# Patient Record
Sex: Female | Born: 1970 | Race: Black or African American | Hispanic: No | Smoking: Never smoker
Health system: Southern US, Community
[De-identification: ages and names within clinical notes are randomized; demographics above are authoritative.]

## PROBLEM LIST (undated history)

## (undated) DIAGNOSIS — I639 Cerebral infarction, unspecified: Secondary | ICD-10-CM

## (undated) DIAGNOSIS — G459 Transient cerebral ischemic attack, unspecified: Secondary | ICD-10-CM

## (undated) DIAGNOSIS — G40909 Epilepsy, unspecified, not intractable, without status epilepticus: Secondary | ICD-10-CM

## (undated) DIAGNOSIS — J45909 Unspecified asthma, uncomplicated: Secondary | ICD-10-CM

## (undated) DIAGNOSIS — J302 Other seasonal allergic rhinitis: Secondary | ICD-10-CM

## (undated) DIAGNOSIS — E559 Vitamin D deficiency, unspecified: Secondary | ICD-10-CM

## (undated) DIAGNOSIS — R7303 Prediabetes: Secondary | ICD-10-CM

## (undated) DIAGNOSIS — R569 Unspecified convulsions: Secondary | ICD-10-CM

## (undated) DIAGNOSIS — D509 Iron deficiency anemia, unspecified: Secondary | ICD-10-CM

## (undated) DIAGNOSIS — Z8632 Personal history of gestational diabetes: Secondary | ICD-10-CM

## (undated) DIAGNOSIS — E538 Deficiency of other specified B group vitamins: Secondary | ICD-10-CM

## (undated) DIAGNOSIS — Z862 Personal history of diseases of the blood and blood-forming organs and certain disorders involving the immune mechanism: Secondary | ICD-10-CM

## (undated) DIAGNOSIS — R600 Localized edema: Secondary | ICD-10-CM

## (undated) DIAGNOSIS — E669 Obesity, unspecified: Secondary | ICD-10-CM

## (undated) DIAGNOSIS — D250 Submucous leiomyoma of uterus: Secondary | ICD-10-CM

## (undated) DIAGNOSIS — D649 Anemia, unspecified: Secondary | ICD-10-CM

## (undated) DIAGNOSIS — M79661 Pain in right lower leg: Secondary | ICD-10-CM

## (undated) DIAGNOSIS — M25561 Pain in right knee: Secondary | ICD-10-CM

## (undated) DIAGNOSIS — K5900 Constipation, unspecified: Secondary | ICD-10-CM

## (undated) DIAGNOSIS — M79662 Pain in left lower leg: Secondary | ICD-10-CM

## (undated) DIAGNOSIS — F419 Anxiety disorder, unspecified: Secondary | ICD-10-CM

## (undated) DIAGNOSIS — D219 Benign neoplasm of connective and other soft tissue, unspecified: Secondary | ICD-10-CM

## (undated) DIAGNOSIS — G43909 Migraine, unspecified, not intractable, without status migrainosus: Secondary | ICD-10-CM

## (undated) DIAGNOSIS — F32A Depression, unspecified: Secondary | ICD-10-CM

## (undated) DIAGNOSIS — Z8619 Personal history of other infectious and parasitic diseases: Secondary | ICD-10-CM

## (undated) DIAGNOSIS — G4730 Sleep apnea, unspecified: Secondary | ICD-10-CM

## (undated) DIAGNOSIS — M2550 Pain in unspecified joint: Secondary | ICD-10-CM

## (undated) DIAGNOSIS — M722 Plantar fascial fibromatosis: Secondary | ICD-10-CM

## (undated) DIAGNOSIS — Z8759 Personal history of other complications of pregnancy, childbirth and the puerperium: Secondary | ICD-10-CM

## (undated) HISTORY — DX: Pain in right lower leg: M79.661

## (undated) HISTORY — DX: Anxiety disorder, unspecified: F41.9

## (undated) HISTORY — DX: Constipation, unspecified: K59.00

## (undated) HISTORY — DX: Pain in unspecified joint: M25.50

## (undated) HISTORY — DX: Plantar fascial fibromatosis: M72.2

## (undated) HISTORY — DX: Pain in right knee: M25.561

## (undated) HISTORY — DX: Sleep apnea, unspecified: G47.30

## (undated) HISTORY — PX: GYNECOLOGIC CRYOSURGERY: SHX857

## (undated) HISTORY — DX: Anemia, unspecified: D64.9

## (undated) HISTORY — DX: Depression, unspecified: F32.A

## (undated) HISTORY — DX: Vitamin D deficiency, unspecified: E55.9

## (undated) HISTORY — DX: Localized edema: R60.0

## (undated) HISTORY — DX: Prediabetes: R73.03

## (undated) HISTORY — DX: Transient cerebral ischemic attack, unspecified: G45.9

## (undated) HISTORY — DX: Benign neoplasm of connective and other soft tissue, unspecified: D21.9

## (undated) HISTORY — DX: Deficiency of other specified B group vitamins: E53.8

## (undated) HISTORY — DX: Morbid (severe) obesity due to excess calories: E66.01

## (undated) HISTORY — DX: Other seasonal allergic rhinitis: J30.2

## (undated) HISTORY — DX: Unspecified asthma, uncomplicated: J45.909

## (undated) HISTORY — PX: CRANIOTOMY FOR TEMPORAL LOBECTOMY: SUR342

## (undated) HISTORY — DX: Cerebral infarction, unspecified: I63.9

## (undated) HISTORY — DX: Epilepsy, unspecified, not intractable, without status epilepticus: G40.909

## (undated) HISTORY — DX: Pain in left lower leg: M79.662

---

## 1998-04-27 ENCOUNTER — Emergency Department (HOSPITAL_COMMUNITY): Admission: EM | Admit: 1998-04-27 | Discharge: 1998-04-27 | Payer: Self-pay | Admitting: Emergency Medicine

## 1998-11-05 HISTORY — PX: OTHER SURGICAL HISTORY: SHX169

## 1999-02-13 ENCOUNTER — Other Ambulatory Visit: Admission: RE | Admit: 1999-02-13 | Discharge: 1999-02-13 | Payer: Self-pay | Admitting: Obstetrics & Gynecology

## 1999-03-13 ENCOUNTER — Other Ambulatory Visit: Admission: RE | Admit: 1999-03-13 | Discharge: 1999-03-13 | Payer: Self-pay | Admitting: Obstetrics & Gynecology

## 1999-06-07 ENCOUNTER — Ambulatory Visit (HOSPITAL_COMMUNITY): Admission: RE | Admit: 1999-06-07 | Discharge: 1999-06-07 | Payer: Self-pay | Admitting: Internal Medicine

## 1999-06-07 ENCOUNTER — Encounter: Payer: Self-pay | Admitting: Internal Medicine

## 1999-07-09 ENCOUNTER — Emergency Department (HOSPITAL_COMMUNITY): Admission: EM | Admit: 1999-07-09 | Discharge: 1999-07-09 | Payer: Self-pay | Admitting: Emergency Medicine

## 1999-08-15 ENCOUNTER — Encounter: Payer: Self-pay | Admitting: Otolaryngology

## 1999-08-15 ENCOUNTER — Ambulatory Visit (HOSPITAL_COMMUNITY): Admission: RE | Admit: 1999-08-15 | Discharge: 1999-08-15 | Payer: Self-pay | Admitting: Otolaryngology

## 1999-08-22 ENCOUNTER — Other Ambulatory Visit: Admission: RE | Admit: 1999-08-22 | Discharge: 1999-08-22 | Payer: Self-pay | Admitting: Obstetrics & Gynecology

## 1999-08-24 ENCOUNTER — Ambulatory Visit (HOSPITAL_BASED_OUTPATIENT_CLINIC_OR_DEPARTMENT_OTHER): Admission: RE | Admit: 1999-08-24 | Discharge: 1999-08-25 | Payer: Self-pay | Admitting: Otolaryngology

## 2001-04-02 ENCOUNTER — Other Ambulatory Visit: Admission: RE | Admit: 2001-04-02 | Discharge: 2001-04-02 | Payer: Self-pay | Admitting: Obstetrics & Gynecology

## 2002-06-19 ENCOUNTER — Other Ambulatory Visit: Admission: RE | Admit: 2002-06-19 | Discharge: 2002-06-19 | Payer: Self-pay | Admitting: Obstetrics & Gynecology

## 2004-01-13 ENCOUNTER — Other Ambulatory Visit: Admission: RE | Admit: 2004-01-13 | Discharge: 2004-01-13 | Payer: Self-pay | Admitting: Obstetrics and Gynecology

## 2005-02-13 ENCOUNTER — Other Ambulatory Visit: Admission: RE | Admit: 2005-02-13 | Discharge: 2005-02-13 | Payer: Self-pay | Admitting: Gynecology

## 2005-02-27 ENCOUNTER — Encounter: Admission: RE | Admit: 2005-02-27 | Discharge: 2005-02-27 | Payer: Self-pay | Admitting: Gastroenterology

## 2006-02-19 ENCOUNTER — Other Ambulatory Visit: Admission: RE | Admit: 2006-02-19 | Discharge: 2006-02-19 | Payer: Self-pay | Admitting: Gynecology

## 2007-04-29 ENCOUNTER — Other Ambulatory Visit: Admission: RE | Admit: 2007-04-29 | Discharge: 2007-04-29 | Payer: Self-pay | Admitting: Obstetrics and Gynecology

## 2007-06-10 ENCOUNTER — Ambulatory Visit (HOSPITAL_COMMUNITY): Admission: RE | Admit: 2007-06-10 | Discharge: 2007-06-10 | Payer: Self-pay | Admitting: Obstetrics and Gynecology

## 2007-07-03 ENCOUNTER — Ambulatory Visit (HOSPITAL_COMMUNITY): Admission: RE | Admit: 2007-07-03 | Discharge: 2007-07-03 | Payer: Self-pay | Admitting: Obstetrics and Gynecology

## 2007-07-31 ENCOUNTER — Ambulatory Visit (HOSPITAL_COMMUNITY): Admission: RE | Admit: 2007-07-31 | Discharge: 2007-07-31 | Payer: Self-pay | Admitting: Obstetrics and Gynecology

## 2007-08-28 ENCOUNTER — Inpatient Hospital Stay (HOSPITAL_COMMUNITY): Admission: AD | Admit: 2007-08-28 | Discharge: 2007-09-01 | Payer: Self-pay | Admitting: Obstetrics and Gynecology

## 2007-09-25 ENCOUNTER — Encounter: Admission: RE | Admit: 2007-09-25 | Discharge: 2007-09-25 | Payer: Self-pay | Admitting: Obstetrics and Gynecology

## 2007-10-29 ENCOUNTER — Inpatient Hospital Stay (HOSPITAL_COMMUNITY): Admission: AD | Admit: 2007-10-29 | Discharge: 2007-11-05 | Payer: Self-pay | Admitting: Obstetrics and Gynecology

## 2009-06-01 ENCOUNTER — Encounter: Admission: RE | Admit: 2009-06-01 | Discharge: 2009-06-01 | Payer: Self-pay | Admitting: Family Medicine

## 2009-11-12 ENCOUNTER — Encounter: Admission: RE | Admit: 2009-11-12 | Discharge: 2009-11-12 | Payer: Self-pay | Admitting: Neurology

## 2009-11-14 ENCOUNTER — Emergency Department (HOSPITAL_COMMUNITY): Admission: EM | Admit: 2009-11-14 | Discharge: 2009-11-14 | Payer: Self-pay | Admitting: Emergency Medicine

## 2009-12-16 ENCOUNTER — Ambulatory Visit (HOSPITAL_COMMUNITY): Admission: RE | Admit: 2009-12-16 | Discharge: 2009-12-16 | Payer: Self-pay | Admitting: Obstetrics & Gynecology

## 2009-12-16 HISTORY — PX: IUD REMOVAL: SHX5392

## 2010-01-19 ENCOUNTER — Emergency Department (HOSPITAL_COMMUNITY): Admission: EM | Admit: 2010-01-19 | Discharge: 2010-01-20 | Payer: Self-pay | Admitting: Emergency Medicine

## 2010-05-01 ENCOUNTER — Emergency Department (HOSPITAL_COMMUNITY): Admission: EM | Admit: 2010-05-01 | Discharge: 2010-05-01 | Payer: Self-pay | Admitting: Emergency Medicine

## 2010-08-11 ENCOUNTER — Ambulatory Visit: Payer: Self-pay | Admitting: Hematology and Oncology

## 2010-08-16 LAB — CBC & DIFF AND RETIC
BASO%: 0.2 % (ref 0.0–2.0)
Basophils Absolute: 0 10*3/uL (ref 0.0–0.1)
HCT: 33.3 % — ABNORMAL LOW (ref 34.8–46.6)
Immature Retic Fract: 5.4 % (ref 0.00–10.70)
LYMPH%: 22.9 % (ref 14.0–49.7)
Platelets: 364 10*3/uL (ref 145–400)
RBC: 4.68 10*6/uL (ref 3.70–5.45)
RDW: 17.1 % — ABNORMAL HIGH (ref 11.2–14.5)
Retic %: 0.79 % (ref 0.50–1.50)
Retic Ct Abs: 36.97 10*3/uL (ref 18.30–72.70)
WBC: 12.3 10*3/uL — ABNORMAL HIGH (ref 3.9–10.3)
lymph#: 2.8 10*3/uL (ref 0.9–3.3)

## 2010-08-16 LAB — MORPHOLOGY: PLT EST: ADEQUATE

## 2010-08-21 ENCOUNTER — Ambulatory Visit (HOSPITAL_COMMUNITY): Admission: RE | Admit: 2010-08-21 | Discharge: 2010-08-21 | Payer: Self-pay | Admitting: Hematology and Oncology

## 2010-08-21 LAB — PROTEIN ELECTROPHORESIS, SERUM, WITH REFLEX
Alpha-1-Globulin: 6 % — ABNORMAL HIGH (ref 2.9–4.9)
Alpha-2-Globulin: 13.9 % — ABNORMAL HIGH (ref 7.1–11.8)
Beta 2: 6.9 % — ABNORMAL HIGH (ref 3.2–6.5)
Gamma Globulin: 18.6 % (ref 11.1–18.8)

## 2010-08-21 LAB — COMPREHENSIVE METABOLIC PANEL
Albumin: 4.1 g/dL (ref 3.5–5.2)
BUN: 9 mg/dL (ref 6–23)
CO2: 24 mEq/L (ref 19–32)
Calcium: 9.2 mg/dL (ref 8.4–10.5)
Chloride: 104 mEq/L (ref 96–112)
Creatinine, Ser: 1.04 mg/dL (ref 0.40–1.20)
Glucose, Bld: 97 mg/dL (ref 70–99)
Potassium: 4.1 mEq/L (ref 3.5–5.3)
Sodium: 139 mEq/L (ref 135–145)

## 2010-08-21 LAB — DIRECT ANTIGLOBULIN TEST (NOT AT ARMC)
DAT (Complement): NEGATIVE
DAT IgG: NEGATIVE

## 2010-08-21 LAB — LEUKOCYTE ALKALINE PHOSPHATASE: Leukocyte Alkaline Phos Stain: 173 — ABNORMAL HIGH (ref 30–140)

## 2010-08-21 LAB — VON WILLEBRAND PANEL
Factor-VIII Activity: 103 % (ref 50–150)
Ristocetin-Cofactor: 111 % (ref 50–150)

## 2010-08-21 LAB — VITAMIN B12: Vitamin B-12: 407 pg/mL (ref 211–911)

## 2010-08-21 LAB — HEMOGLOBINOPATHY EVALUATION
Hgb A: 97.5 % (ref 96.8–97.8)
Hgb F Quant: 0 % (ref 0.0–2.0)

## 2010-08-21 LAB — IRON AND TIBC: %SAT: 6 % — ABNORMAL LOW (ref 20–55)

## 2010-11-01 ENCOUNTER — Ambulatory Visit
Admission: RE | Admit: 2010-11-01 | Discharge: 2010-11-01 | Payer: Self-pay | Source: Home / Self Care | Attending: Family Medicine | Admitting: Family Medicine

## 2010-11-01 ENCOUNTER — Encounter (INDEPENDENT_AMBULATORY_CARE_PROVIDER_SITE_OTHER): Payer: Self-pay | Admitting: Family Medicine

## 2010-11-26 ENCOUNTER — Encounter: Payer: Self-pay | Admitting: Gastroenterology

## 2011-01-13 ENCOUNTER — Emergency Department (HOSPITAL_COMMUNITY): Payer: Federal, State, Local not specified - PPO

## 2011-01-13 ENCOUNTER — Emergency Department (HOSPITAL_COMMUNITY)
Admission: EM | Admit: 2011-01-13 | Discharge: 2011-01-13 | Disposition: A | Discharge status: Home / Self Care | Payer: Federal, State, Local not specified - PPO | Attending: Emergency Medicine | Admitting: Emergency Medicine

## 2011-01-13 DIAGNOSIS — M129 Arthropathy, unspecified: Secondary | ICD-10-CM | POA: Insufficient documentation

## 2011-01-13 DIAGNOSIS — M25569 Pain in unspecified knee: Secondary | ICD-10-CM | POA: Insufficient documentation

## 2011-01-21 LAB — POCT PREGNANCY, URINE: Preg Test, Ur: NEGATIVE

## 2011-01-21 LAB — DIFFERENTIAL
Basophils Relative: 0 % (ref 0–1)
Eosinophils Absolute: 0 10*3/uL (ref 0.0–0.7)
Eosinophils Relative: 0 % (ref 0–5)
Lymphocytes Relative: 22 % (ref 12–46)
Lymphs Abs: 2.6 10*3/uL (ref 0.7–4.0)
Monocytes Absolute: 0.6 10*3/uL (ref 0.1–1.0)
Monocytes Relative: 5 % (ref 3–12)
Neutro Abs: 8.3 10*3/uL — ABNORMAL HIGH (ref 1.7–7.7)

## 2011-01-21 LAB — URINALYSIS, ROUTINE W REFLEX MICROSCOPIC
Bilirubin Urine: NEGATIVE
Glucose, UA: NEGATIVE mg/dL
Nitrite: NEGATIVE
Specific Gravity, Urine: 1.031 — ABNORMAL HIGH (ref 1.005–1.030)

## 2011-01-21 LAB — URINE MICROSCOPIC-ADD ON

## 2011-01-21 LAB — BASIC METABOLIC PANEL
BUN: 10 mg/dL (ref 6–23)
Calcium: 8.6 mg/dL (ref 8.4–10.5)
Chloride: 100 mEq/L (ref 96–112)
Creatinine, Ser: 0.87 mg/dL (ref 0.4–1.2)
Glucose, Bld: 117 mg/dL — ABNORMAL HIGH (ref 70–99)
Potassium: 3.5 mEq/L (ref 3.5–5.1)
Sodium: 133 mEq/L — ABNORMAL LOW (ref 135–145)

## 2011-01-21 LAB — CBC
MCV: 70.5 fL — ABNORMAL LOW (ref 78.0–100.0)
RBC: 4.54 MIL/uL (ref 3.87–5.11)
RDW: 17.7 % — ABNORMAL HIGH (ref 11.5–15.5)

## 2011-01-24 LAB — CBC
HCT: 33.3 % — ABNORMAL LOW (ref 36.0–46.0)
Hemoglobin: 10.8 g/dL — ABNORMAL LOW (ref 12.0–15.0)
MCV: 71.5 fL — ABNORMAL LOW (ref 78.0–100.0)
Platelets: 414 10*3/uL — ABNORMAL HIGH (ref 150–400)
RDW: 18.6 % — ABNORMAL HIGH (ref 11.5–15.5)

## 2011-01-29 LAB — COMPREHENSIVE METABOLIC PANEL
AST: 13 U/L (ref 0–37)
Albumin: 3.7 g/dL (ref 3.5–5.2)
Chloride: 103 mEq/L (ref 96–112)
Creatinine, Ser: 1.07 mg/dL (ref 0.4–1.2)
GFR calc Af Amer: >60 mL/min (ref >60)
Total Bilirubin: 0.3 mg/dL (ref 0.3–1.2)

## 2011-01-29 LAB — CBC
MCV: 72.1 fL — ABNORMAL LOW (ref 78.0–100.0)
Platelets: 395 10*3/uL (ref 150–400)
WBC: 16.4 10*3/uL — ABNORMAL HIGH (ref 4.0–10.5)

## 2011-01-29 LAB — DIFFERENTIAL
Basophils Absolute: 0.1 10*3/uL (ref 0.0–0.1)
Eosinophils Relative: 0 % (ref 0–5)
Lymphocytes Relative: 10 % — ABNORMAL LOW (ref 12–46)
Lymphs Abs: 1.6 10*3/uL (ref 0.7–4.0)
Monocytes Absolute: 0.5 10*3/uL (ref 0.1–1.0)

## 2011-01-29 LAB — URINE MICROSCOPIC-ADD ON

## 2011-01-29 LAB — URINALYSIS, ROUTINE W REFLEX MICROSCOPIC
Nitrite: NEGATIVE
Specific Gravity, Urine: 1.024 (ref 1.005–1.030)
Urobilinogen, UA: 0.2 mg/dL (ref 0.0–1.0)

## 2011-01-29 LAB — POCT CARDIAC MARKERS
CKMB, poc: 1.2 ng/mL (ref 1.0–8.0)
CKMB, poc: <1 ng/mL — ABNORMAL LOW (ref 1.0–8.0)
Troponin i, poc: 0.08 ng/mL (ref 0.00–0.09)
Troponin i, poc: <0.05 ng/mL (ref 0.00–0.09)

## 2011-02-20 ENCOUNTER — Encounter (HOSPITAL_BASED_OUTPATIENT_CLINIC_OR_DEPARTMENT_OTHER): Payer: Federal, State, Local not specified - PPO | Admitting: Hematology and Oncology

## 2011-02-20 ENCOUNTER — Other Ambulatory Visit: Payer: Self-pay | Admitting: Hematology and Oncology

## 2011-02-20 DIAGNOSIS — G40909 Epilepsy, unspecified, not intractable, without status epilepticus: Secondary | ICD-10-CM

## 2011-02-20 DIAGNOSIS — D72829 Elevated white blood cell count, unspecified: Secondary | ICD-10-CM

## 2011-02-20 DIAGNOSIS — E538 Deficiency of other specified B group vitamins: Secondary | ICD-10-CM

## 2011-02-20 DIAGNOSIS — D649 Anemia, unspecified: Secondary | ICD-10-CM

## 2011-02-20 LAB — CBC WITH DIFFERENTIAL/PLATELET
Basophils Absolute: 0 10*3/uL (ref 0.0–0.1)
EOS%: 1.5 % (ref 0.0–7.0)
Eosinophils Absolute: 0.2 10*3/uL (ref 0.0–0.5)
HGB: 10.9 g/dL — ABNORMAL LOW (ref 11.6–15.9)
MCH: 23 pg — ABNORMAL LOW (ref 25.1–34.0)
NEUT#: 9.6 10*3/uL — ABNORMAL HIGH (ref 1.5–6.5)
RBC: 4.74 10*6/uL (ref 3.70–5.45)
RDW: 18.8 % — ABNORMAL HIGH (ref 11.2–14.5)
lymph#: 2.8 10*3/uL (ref 0.9–3.3)

## 2011-02-20 LAB — COMPREHENSIVE METABOLIC PANEL
AST: 11 U/L (ref 0–37)
Albumin: 4.4 g/dL (ref 3.5–5.2)
BUN: 16 mg/dL (ref 6–23)
Calcium: 9.4 mg/dL (ref 8.4–10.5)
Chloride: 103 mEq/L (ref 96–112)
Potassium: 3.9 mEq/L (ref 3.5–5.3)
Sodium: 142 mEq/L (ref 135–145)
Total Protein: 7.3 g/dL (ref 6.0–8.3)

## 2011-02-20 LAB — IRON AND TIBC
%SAT: 6 % — ABNORMAL LOW (ref 20–55)
Iron: 20 ug/dL — ABNORMAL LOW (ref 42–145)

## 2011-03-20 NOTE — Discharge Summary (Signed)
Tracy Mcpherson, Tracy Mcpherson               ACCOUNT NO.:  [id number]   MEDICAL RECORD NO.:  [id number]          PATIENT TYPE:  INP   LOCATION:  9153                          [facility]ILITY:  [facility]   PHYSICIAN:  Janine Limbo, M.D.DATE OF BIRTH:  [birth year]   DATE OF ADMISSION:  08/28/2007  DATE OF DISCHARGE:  09/01/2007                               DISCHARGE SUMMARY   ADMISSION DIAGNOSES:  1. Intrauterine pregnancy at 29-2/7 weeks.  2. Pre-term labor with positive fetal fibronectin.  3. Questionable herpes prodrome.   DISCHARGE DIAGNOSES:  1. Intrauterine pregnancy at 29-2/7 weeks.  2. Pre-term labor with positive fetal fibronectin.  3. Questionable herpes prodrome.  4. Gestational diabetes and stable pre-term labor   HOSPITAL PROCEDURES:  1. Electronic fetal monitoring.  2. Dexamethasone series.  3. Diabetic teaching.  4. Insulin administration.   HOSPITAL COURSE:  The patient was admitted with a contractions that she  had had for 3 days.   EXAMINATION:  On exam, her cervix was closed but was 50% to 60% effaced,  and fetal fibronectin was done and came back positive.  She was admitted  for treatment of pre-term labor at 29 weeks.  She had a past history of  an increased fasting blood sugar on a 3-hour GTT, and so it was decided  that she was a gestational diabetic, so she was placed on blood sugar  monitoring and insulin administration.  GC and chlamydia were negative.   LABS:  Were normal on August 29, 2007.  She had a dietician and  diabetic consultation on October 24.  Magnesium sulfate had been started  and was continued.  On October 26, her magnesium sulfate was  discontinued, and she did well on.  On October 27, she was having just  irregular contractions and some irritability.  Fetal heart rate was  stable.  Group B strep was negative.  CBG fasting was 115, two-hour PCs  were 129.  She had an ultrasound for cervical length which was 3.5 cm.  She was deemed to receive full  benefit of her hospital stay and was  discharged home.   DISCHARGE MEDICATIONS:  1. Monistat x7 days p.r.n. itching.  2. NPH insulin 12 units q.h.s. with blood sugar monitoring.  3. Procardia XL 30 mg daily.   DISCHARGE LABS:  Fetal fibronectin was positive, white blood cell count  22.4, hemoglobin 9.9, platelets 542, group B strep negative, GC  chlamydia negative.   DISCHARGE INSTRUCTIONS:  Include bedrest, blood sugar monitoring and  diabetic diet, and discharge follow-up will occur later this week in the  office for evaluation of blood sugars and progress.   CONDITION ON DISCHARGE:  Good.      [name], C.N.M.      Janine Limbo, M.D.  Electronically Signed    MLW/MEDQ  D:  09/01/2007  T:  09/02/2007  Job:  [id number]

## 2011-03-20 NOTE — Discharge Summary (Signed)
Tracy Mcpherson, Tracy Mcpherson               ACCOUNT NO.:  [id number]   MEDICAL RECORD NO.:  [id number]          PATIENT TYPE:  INP   LOCATION:  9105                          [facility]   PHYSICIAN:  Hal Morales, M.D.DATE OF BIRTH:  04-13-1971   DATE OF ADMISSION:  10/29/2007  DATE OF DISCHARGE:  11/05/2007                               DISCHARGE SUMMARY   ADMISSION DIAGNOSES:  1. Intrauterine pregnancy at 38-2/7 weeks.  2. Gestational hypertension.  3. Insulin dependent gestational diabetes.   DISCHARGE DIAGNOSES:  1. Intrauterine pregnancy at 38-6/7 weeks.  2. Probable chronic hypertension.  3. Gestational diabetes insulin dependent.  4. Morbid obesity.  5. Failed induction of labor.  6. Fibroids.   PROCEDURES:  1. Primary low transverse cesarean section.  2. Spinal anesthesia.  3. Failed induction.   HOSPITAL COURSE:  1. Tracy Mcpherson is a 40 year old gravida 4, para 0-0-3-0 at 38-2/7      weeks who was admitted on the evening of October 29, 2007      secondary to gestational hypertension.  She also had protein in the      urine specimen voided in the office.  History was remarkable for      increased Down syndrome risk  2. Increased body mass index.  3. Advanced maternal age.  4. Fibroids.  5. History of herpes type 2.  6. Gestational diabetes on insulin.  7. Gestational hypertension.   On admission, blood pressures were 129-145 over 71-92.  CBG on admission  was 111.  Fetal heart rate was reactive.  No contractions were noted.  Cervix was closed, 50% vertex and a minus three station.  There were no  HSV lesions noted.  PIH labs were normal.  Cervidil was placed on the  evening of December 24.  By the morning of December 25, Cervidil was  removed and at approximately 10:40.  Cervix remained closed and high.  A  24-hour urine protein was begun.  Once the Cervidil was removed Pitocin  was begun.  Through the day, however, no significant change was noted.  Cervidil  was placed again on the evening of December 25.  By the next  day, blood pressures were 120-140 over 70s-80s.  Fetal heart rate  reassuring.  By the afternoon, cervix remained long and closed.  By that  evening, urine protein was resulted at 176, cervix remained closed 40%  with a vertex ballotable.  Options reviewed with the patient including C-  section, discharge home or induction.  The patient did wish to proceed  with induction and understand the need for possible C-section and the  risks and benefits involved in that.  Cervidil was placed again on the  evening of December 26.  By the next morning, Cervidil was removed.  Mild contractions were noted by late that evening.  Fetal heart rate had  remained reactive.  There were some very sporadic occurrences of late  decelerations when patient was positioned on her back, but she had 12  hours of Pitocin with essentially long going negative CST.  Cervix, at  that time, was closed 70%  vertex at minus three station.  The patient  also had been on insulin for the Glucotrol.  Again the same discussions  were held with the patient regarding options.  The patient did wish to  proceed again with Cervidil.  This was placed again on the evening of  December 27.  By the morning of December 28, cervix was closed and  Cervidil was removed.  Fetal heart rate was stable.  Blood pressures  were in the 130s over 80s.  Options were reviewed again with the patient  and her husband and the patient, at that time, wanted to proceed with  cesarean section in light of failed induction of labor.  The patient was  taken to the operating room where a primary low transverse cesarean  section was performed for a viable female infant weight 6 pounds 13  ounces.  Apgars were 9 and 9, his name was [facility].  Dr. Pennie Rushing was the  surgeon.  This was done under spinal anesthesia.  The patient tolerated  the procedure well and was taken to recovery in good condition.  Infant   was taken to the full-term nursery.  By postoperative day #1, the  patient doing well.  Her Foley was removed without difficulty.  She was  bottle feeding.  She did have some itching with Vicodin.  Fasting blood  sugar was 90, 2-hour PCs were in the 108 to 110s. Hemoglobin was 9.4  which was up from 8.8 the day before.  White blood cell count was 12.4,  platelet count was 484.  Orthostatics were stable with a slight increase  in the pulse but no other significant changes.  The patient declined  transfusion.  She was placed on iron supplement.  CBGs were done,  fasting and 2-hour PC.  By the next day, the patient was concerned about  some swelling.  She felt that this did not have any better since  delivery and, in fact, had gotten somewhat worse.  Her blood pressures  were within normal limits.  She was using some p.o. Dilaudid and Motrin  for pain.  CBGs that morning fasting was 117, 2-hour PCs were in the 122-  137 range.  JP drain was draining a small amount of serosanguineous  drainage.  HCTZ was begun 25 mg p.o. daily that day and daily weights  were initiated.  By postoperative day #3, the patient was doing well.  She was up ad lib.  She felt the swelling was slightly better.  She had  lost 3 pounds, down to 342 pounds from 345 the day before.  Her incision  was clean, dry and intact.  Her JP drain was essentially draining  minimal serosanguineous drainage.  This was removed without difficulty  by Dr. Pennie Rushing.  She did have 2+ edema in her lower extremities.  Fasting blood sugar was 107, 2-hour PCs were 123-128.  Dr. Pennie Rushing saw  the patient and deemed that she had received the full benefit of her  hospital stay.  The patient was then discharged home.  She was given  strong instructions to continue to follow the modified-carbohydrate diet  that she had been instructed to follow during pregnancy to moderate her  increase risk for type 2 diabetes.  The patient then was discharged home   on November 05, 2007.   DISCHARGE INSTRUCTIONS:  Per [facility] handout.  The patient  is also to follow the ongoing modified-carbohydrate diet for insulin  dependent gestational diabetes.   DISCHARGE  MEDICATIONS:  1. Motrin 600 mg p.o. q.6 h p.r.n. pain.  2. Darvocet N 100 one to two daily 4 hours p.r.n. pain.  3. HCTZ 25 mg one p.o. daily.  4. NuvaRing to be inserted one ring every 28 days.   DISCHARGE FOLLOW-UP:  The patient will have a [facility] nurse come to  see her in 1 week for blood pressure check.  At 6 weeks postpartum, she  will be seen at [facility] with the plan made for follow-up on  her history of gestational diabetes, at that time, with the option for a  fasting blood sugar and a hemoglobin A1c.      Renaldo Reel Emilee Hero, C.N.M.      Hal Morales, M.D.  Electronically Signed    VLL/MEDQ  D:  11/05/2007  T:  11/05/2007  Job:  [id number]

## 2011-03-20 NOTE — H&P (Signed)
[name], [name]               ACCOUNT NO.:  [id number]   MEDICAL RECORD NO.:  [id number]          PATIENT TYPE:  INP   LOCATION:  9160                          [facility]   PHYSICIAN:  Osborn Coho, M.D.   DATE OF BIRTH:  Mar 17, 1971   DATE OF ADMISSION:  10/29/2007  DATE OF DISCHARGE:                              HISTORY & PHYSICAL   This is a 36-year gravida 4, para 0-0-3-0 at 26 and 2/7 weeks who  presents for [facility] evaluation.  She has had a headache today.  Blood  pressures have been 118-146 over 82-92 with 2+ protein in the office.  History is unremarkable for increased Down syndrome risk, increased BMI,  AMA, fibroids, history of herpes 2, gestational diabetes and PIH.   ALLERGIES:  MORPHINE, PERCOCET AND FLEXERIL.   OBSTETRIC HISTORY:  Remarkable for induced abortion in 1990, spontaneous  abortion 1992, and an induced abortion in 1994.   MEDICAL HISTORY:  Remarkable for history of cryosurgery, HSV 2,  childhood varicella.   SURGICAL HISTORY:  Remarkable for throat surgery in 2000 and 2 induced  abortions and cryosurgery.   FAMILY HISTORY:  Remarkable for mother and some sisters with  hypertension, grandmother and aunts with diabetes.  Sister with  seizures.   GENETIC HISTORY:  Remarkable for father of the baby with a heart murmur.   SOCIAL HISTORY:  The patient is married to [facility] who is  involved and supportive.  She works as a Child psychotherapist.  She does not  report a religious affiliation.  She denies any alcohol, tobacco or drug  use.   PRENATAL LABORATORY:  Hemoglobin 11.5, platelets 508.  Blood type O  positive, antibody screen negative, RPR nonreactive, rubella immune.  Hepatitis negative, HIV negative, chlamydia negative, gonorrhea  negative.  TSH normal.   HISTORY OF CURRENT PREGNANCY:  The patient entered care at 8 weeks'  gestation.  She had an early ultrasound to confirm dates.  Transferred  care to [facility] at 18 weeks and had  ultrasound at maternal-  fetal medicine showing fibroids and treated for ear infection at 22  weeks.  She had an ultrasound at 26 weeks that was normal.  Glucola at  26 weeks was elevated.  Three-hour GTT:  One value was abnormal.  She  was started on some Procardia for preterm contractions.  She was begun  on insulin for her diabetes.  She had an ultrasound to measure the baby  and the baby measured 69 percentile with elevated AFI of 95th  percentile.  She had pretty good control of her sugars throughout the  pregnancy but did have to be increased on her insulin several times.  She had another ultrasound at 35 weeks showing 61% growth and presents  today for induction.   OBJECTIVE DATA:  VITAL SIGNS:  Stable.  Blood pressures 129-145 over 71-  92.  CBG is 111.  HEENT:  Within normal limits.  Thyroid normal not enlarged.  CHEST:  Clear to auscultation.  HEART RATE:  Regular rate and rhythm.  ABDOMEN:  Gravid.  EFM shows reactive fetal heart  rate with no  contractions.  Cervix is closed, 50%, -3, vertex posterior.  There are  no HSV lesions.  EXTREMITIES:  Show trace to 1+ edema.  DTRs are 1-2+ with no clonus.   CBC shows hemoglobin 9.2, platelets 539.  Chemistries within normal  limits.  AST 32, ALT 16, uric acid 5.5.  Urine is pending.   ASSESSMENT:  1. Intrauterine pregnancy at 81 and 2/7 weeks.  2. Pregnancy-induced hypertension.  3. Gestational diabetes.   PLAN:  1. Admit per Dr. Su Hilt.  2. Routine MD orders.  3. Cervidil tonight and then Pitocin in the morning.  4. CBGs every 4 hours then every 2 hours tomorrow.      Tracy Mcpherson, C.N.M.      Osborn Coho, M.D.  Electronically Signed    MLW/MEDQ  D:  10/29/2007  T:  10/30/2007  Job:  [id number]

## 2011-03-20 NOTE — Op Note (Signed)
Tracy Mcpherson, Tracy Mcpherson               ACCOUNT NO.:  [id number]   MEDICAL RECORD NO.:  [id number]          PATIENT TYPE:  INP   LOCATION:  9105                          [facility]   PHYSICIAN:  Hal Morales, M.D.DATE OF BIRTH:  03-18-1971   DATE OF PROCEDURE:  11/02/2007  DATE OF DISCHARGE:                               OPERATIVE REPORT   PREOPERATIVE DIAGNOSES:  1. Intrauterine pregnancy at 38 weeks and 6 days.  2. Probable chronic hypertension.  3. Gestational diabetes.  4. Obesity.  5. Failed induction of labor.  6. Uterine fibroids.   POSTOPERATIVE DIAGNOSES:  1. Intrauterine pregnancy at 38 weeks and 6 days.  2. Probable chronic hypertension.  3. Gestational diabetes.  4. Obesity.  5. Failed induction of labor.  6. Uterine fibroids.   OPERATION:  Primary low transverse cesarean section.   SURGEON:  Dr. Dierdre Forth.   FIRST ASSISTANT:  [name], C.N.M.   ANESTHESIA:  Spinal.   ESTIMATED BLOOD LOSS:  750 mL.   COMPLICATIONS:  None.   FINDINGS:  The patient was delivered of a female infant weighing 6 pounds  13 ounces with a Apgars of 9 and 9 at 1 and 5 minutes, respectively.  The tubes and ovaries were normal for the gravid state.  The uterus  contained several uterine fibroids, the smallest which was approximately  2 cm, the largest of which was approximately 10 cm.   PREOPERATIVE DISCUSSION:  Several discussions had been held with the  patient concerning the concerns around her blood pressure, her  unfavorable cervix and the nonresponse to cervical ripening agents over  three different tries.  Once the cervix was noted to still be closed and  firm, after removal of Cervidil that had been placed 12 hours ago, a  discussion was again held with the patient concerning options for  management.  These options included discharge home for continued twice  weekly antepartum testing and awaiting spontaneous labor up until 40  weeks, repeat Pitocin  induction, and elective with cesarean section.  The risks and benefits were reviewed in detail and the patient opted to  proceed with cesarean section.   PROCEDURE:  The patient was taken to the operating room after  appropriate identification and placed on the operating table.  After the  placement of spinal anesthetic, she was placed in the supine position  with a left lateral tilt.  The abdomen, perineum and vagina were prepped  with multiple layers of Betadine and a Foley catheter inserted into the  bladder and connected to straight drainage.  The abdomen was draped as a  sterile field.  After assurance of adequate surgical anesthesia, the  suprapubic region was infiltrated with 10 mL of 25% Marcaine.  A  suprapubic incision was made and the abdomen opened in layers.  Peritoneum was entered and the bladder blade placed.  The uterus was  incised approximately 2 cm above the uterovesical fold and that incision  taken laterally on each side bluntly.  The infant was then delivered  from the occiput transverse position with the aid of a Kiwi vacuum  extractor.  The nares and pharynx were suctioned and the cord clamped  and cut.  The infant was handed off to the awaiting pediatricians.  The  placenta was allowed to spontaneously detach from the uterus and was  removed from the operative field.  The uterine cavity was cleared of  products of conception with laparotomy pads.  The cervix was dilated.  The uterine incision was closed with running interlocking suture of zero  Vicryl.  An imbricating suture of zero Vicryl was then placed.  Hemostasis was adequate.  Copious irrigation was carried out and the  abdominal peritoneum closed with running suture of 2-0 Vicryl.  The  rectus muscles were approximated in the midline with figure-of-eight  suture of 2-0 Vicryl.  The rectus fascia was closed with running suture  of zero Vicryl, then reinforced on either side of midline with figure-of-  eight  sutures of zero Vicryl.  The subcutaneous tissue was made  hemostatic with Bovie cautery and irrigated.  A subcutaneous 7 mm  Jackson-Pratt drain was placed through a stab incision in the left lower  quadrant.  It was sewn in with a suture of zero silk.  Three interrupted  sutures of 2-0 Vicryl was placed in the subcutaneous tissue and the skin  incision closed with a subcuticular suture of 3-0 Monocryl.  Steri-  Strips were applied and a sterile dressing applied.  The patient was  taken from the operating room to the recovery room in satisfactory  condition having tolerated procedure well with sponge and instrument  counts correct.  The infant went to the full-term nursery.      Hal Morales, M.D.  Electronically Signed     VPH/MEDQ  D:  11/02/2007  T:  11/03/2007  Job:  [id number]

## 2011-03-20 NOTE — H&P (Signed)
Tracy Mcpherson, BALDUCCI               ACCOUNT NO.:  [id number]   MEDICAL RECORD NO.:  [id number]          PATIENT TYPE:  INP   LOCATION:  9153                          [facility]   PHYSICIAN:  Hal Morales, M.D.DATE OF BIRTH:  1971/10/23   DATE OF ADMISSION:  08/28/2007  DATE OF DISCHARGE:                              HISTORY & PHYSICAL   This is a 40 year old gravida 4, para 0-0-3-0 at 29-2/7 weeks who  presents with complaints of contractions for 3 days.  She denies leaking  or bleeding.  Reports positive fetal movement.  Pregnancy gas been  followed by midwife and doctors and has been remarkable for:  1. Borderline AMA.  2. Cryosurgery.  3. EAB x2.  SAB x1.  4. HSV-2.  5. Morbid obesity.  6. Multiple fibroids.  7. Allergic to MORPHINE and PERCOCET.  8. Hiatal hernia.  9. Preterm contractions in September with a negative fetal      fibronectin.  10.Complaints of questionable prodrome of HSV.  11.Abnormal Glucola with a normal 3-hour GTT.   ALLERGIES:  MORPHINE and PERCOCET cause hives.  She is also allergic to  [facility].   OB HISTORY:  Remarkable for induced abortion in 1990 and 1994,  spontaneous abortion in 1992.   PAST MEDICAL HISTORY:  1. Remarkable for the cervical dysplasia with cryosurgery and history      of HSV-2.  2. She had childhood varicella.  3. She had multiple uterine fibroids.  4. Morbid obesity.   PAST SURGICAL HISTORY:  1. 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  2. EAB x2.   FAMILY HISTORY:  Family history is remarkable for mother and sisters  with hypertension, grandmother and aunts with diabetes.  Sister with  seizures in the past.   GENETIC HISTORY:  Remarkable for a past history of heart murmur.   SOCIAL HISTORY:  The patient is married to [facility] who is  involved and supportive.  She works as a Child psychotherapist.  She does not  report a  religious affiliation.  She denies any  alcohol, tobacco or  drug use.   PRENATAL LABS:  Hemoglobin 11.5, platelets 508.  Blood type O+, antibody  screen negative, RPR nonreactive, rubella immune.  Hepatitis negative,  HIV negative, gonorrhea negative, Chlamydia negative.  TSH 0.65.   HISTORY OF CURRENT PREGNANCY:  The patient entered care at [redacted] weeks  gestation.  She had first trimester ultrasound to determine dates,  transferred to [place] at 18 weeks and had ultrasound At  maternal fetal medicine which was remarkable for fibroids and no other  report is available.  She had some cramps at 24 weeks with a negative  fetal fibronectin and recurrence of cramps at 26 weeks with the another  negative fetal fibronectin.  She had an elevated Glucola.  Had a three-  hour GTT last week which showed elevated fasting sugar of 114 and other  values were within normal range.   OBJECTIVE:  VITAL SIGNS:  Stable, afebrile.  HEENT: Within normal limits.  NECK:  Thyroid normal, not enlarged.  CHEST:  Clear to auscultation.  HEART:  Rate regular rate and rhythm.  ABDOMEN:  Gravid.  Fetal monitor shows reactive fetal heart rate  initially with no decelerations with uterine contractions every 2-3  minutes.  She was given a dose terbutaline and now her contractions are  every 3-5 minutes and perceived as stronger. Cervix was closed, 50 to  60% effaced, minus three station with vertex presentation.  No vaginal  lesions are visible but exam was difficult secondary to habitus and  discomfort.  A GC and Chlamydia and Group B strep were collected.  Fetal  fibronectin was collected and was found to be positive.  Urinalysis is  pending.   ASSESSMENT:  1. Intrauterine pregnancy at 29-2/7 weeks.  2. Preterm labor with positive fetal fibronectin.  3. Questionable herpes simplex virus prodrome.   PLAN:  1. Admit to antenatal per Dr. Pennie Rushing.  2. Tocolysis with Procardia series.  3. Dexamethasone series and further orders to  follow.      [name], C.N.M.      Hal Morales, M.D.  Electronically Signed    MLW/MEDQ  D:  08/28/2007  T:  08/30/2007  Job:  [id number]

## 2011-07-19 ENCOUNTER — Other Ambulatory Visit: Payer: Self-pay | Admitting: Obstetrics and Gynecology

## 2011-07-19 DIAGNOSIS — R102 Pelvic and perineal pain: Secondary | ICD-10-CM

## 2011-07-24 ENCOUNTER — Other Ambulatory Visit: Payer: Federal, State, Local not specified - PPO

## 2011-08-10 LAB — URINALYSIS, ROUTINE W REFLEX MICROSCOPIC
Bilirubin Urine: NEGATIVE
Ketones, ur: NEGATIVE
Leukocytes, UA: NEGATIVE
Nitrite: NEGATIVE
Protein, ur: 30 — AB
pH: 6

## 2011-08-10 LAB — URIC ACID: Uric Acid, Serum: 5.5

## 2011-08-10 LAB — CBC
HCT: 26.7 — ABNORMAL LOW
HCT: 27.9 — ABNORMAL LOW
Hemoglobin: 8.8 — ABNORMAL LOW
Hemoglobin: 9.2 — ABNORMAL LOW
MCHC: 32.9
MCV: 66 — ABNORMAL LOW
Platelets: 484 — ABNORMAL HIGH
Platelets: 539 — ABNORMAL HIGH
RBC: 4.02
RBC: 4.22
RDW: 19.6 — ABNORMAL HIGH
WBC: 14.3 — ABNORMAL HIGH

## 2011-08-10 LAB — LACTATE DEHYDROGENASE: LDH: 679 — ABNORMAL HIGH

## 2011-08-10 LAB — TYPE AND SCREEN: Antibody Screen: NEGATIVE

## 2011-08-10 LAB — COMPREHENSIVE METABOLIC PANEL
Alkaline Phosphatase: 132 — ABNORMAL HIGH
BUN: 7
CO2: 20
Chloride: 107
Creatinine, Ser: 0.77
GFR calc non Af Amer: >60
Glucose, Bld: 136 — ABNORMAL HIGH
Potassium: 4.4
Total Bilirubin: 0.4

## 2011-08-10 LAB — PROTEIN, URINE, 24 HOUR
Collection Interval-UPROT: 24
Protein, Urine: 9
Urine Total Volume-UPROT: 1950

## 2011-08-10 LAB — CREATININE CLEARANCE, URINE, 24 HOUR
Collection Interval-CRCL: 24
Creatinine Clearance: 150 — ABNORMAL HIGH
Creatinine, 24H Ur: 1667
Urine Total Volume-CRCL: 1950

## 2011-08-10 LAB — URINE MICROSCOPIC-ADD ON

## 2011-08-15 LAB — STREP B DNA PROBE

## 2011-08-15 LAB — URINALYSIS, ROUTINE W REFLEX MICROSCOPIC
Leukocytes, UA: NEGATIVE
Nitrite: NEGATIVE
Protein, ur: 30 — AB
Urobilinogen, UA: 0.2

## 2011-08-15 LAB — CBC
MCV: 68.9 — ABNORMAL LOW
Platelets: 542 — ABNORMAL HIGH
WBC: 22.4 — ABNORMAL HIGH

## 2011-08-15 LAB — COMPREHENSIVE METABOLIC PANEL
AST: 18
Albumin: 2.6 — ABNORMAL LOW
Chloride: 104
Creatinine, Ser: 0.76
GFR calc Af Amer: >60
Total Bilirubin: 0.5

## 2011-08-15 LAB — URINE MICROSCOPIC-ADD ON

## 2011-08-15 LAB — GC/CHLAMYDIA PROBE AMP, GENITAL: GC Probe Amp, Genital: NEGATIVE

## 2011-12-10 ENCOUNTER — Telehealth: Payer: Self-pay | Admitting: Hematology and Oncology

## 2011-12-10 NOTE — Telephone Encounter (Signed)
l/m with appt info for 02/20/12 per pof from 02/20/11 aom

## 2012-02-20 ENCOUNTER — Ambulatory Visit (HOSPITAL_BASED_OUTPATIENT_CLINIC_OR_DEPARTMENT_OTHER): Payer: Federal, State, Local not specified - PPO | Admitting: Hematology and Oncology

## 2012-02-20 ENCOUNTER — Encounter: Payer: Self-pay | Admitting: Hematology and Oncology

## 2012-02-20 ENCOUNTER — Other Ambulatory Visit (HOSPITAL_BASED_OUTPATIENT_CLINIC_OR_DEPARTMENT_OTHER): Payer: Federal, State, Local not specified - PPO | Admitting: Lab

## 2012-02-20 VITALS — BP 115/66 | HR 84 | Temp 97.5°F | Ht 67.0 in | Wt 319.9 lb

## 2012-02-20 DIAGNOSIS — D539 Nutritional anemia, unspecified: Secondary | ICD-10-CM

## 2012-02-20 DIAGNOSIS — R569 Unspecified convulsions: Secondary | ICD-10-CM

## 2012-02-20 DIAGNOSIS — G40909 Epilepsy, unspecified, not intractable, without status epilepticus: Secondary | ICD-10-CM | POA: Insufficient documentation

## 2012-02-20 DIAGNOSIS — D72829 Elevated white blood cell count, unspecified: Secondary | ICD-10-CM

## 2012-02-20 DIAGNOSIS — D649 Anemia, unspecified: Secondary | ICD-10-CM

## 2012-02-20 LAB — VITAMIN B12: Vitamin B-12: 400 pg/mL (ref 211–911)

## 2012-02-20 LAB — COMPREHENSIVE METABOLIC PANEL
Alkaline Phosphatase: 106 U/L (ref 39–117)
BUN: 12 mg/dL (ref 6–23)
Glucose, Bld: 101 mg/dL — ABNORMAL HIGH (ref 70–99)
Total Bilirubin: 0.1 mg/dL — ABNORMAL LOW (ref 0.3–1.2)

## 2012-02-20 LAB — CBC WITH DIFFERENTIAL/PLATELET
Basophils Absolute: 0.1 10*3/uL (ref 0.0–0.1)
EOS%: 1.2 % (ref 0.0–7.0)
HCT: 33.4 % — ABNORMAL LOW (ref 34.8–46.6)
HGB: 10.7 g/dL — ABNORMAL LOW (ref 11.6–15.9)
LYMPH%: 24.3 % (ref 14.0–49.7)
MCH: 23.5 pg — ABNORMAL LOW (ref 25.1–34.0)
MCV: 73.1 fL — ABNORMAL LOW (ref 79.5–101.0)
MONO%: 5.9 % (ref 0.0–14.0)
NEUT%: 68.1 % (ref 38.4–76.8)
Platelets: 383 10*3/uL (ref 145–400)

## 2012-02-20 LAB — IRON AND TIBC
TIBC: 281 ug/dL (ref 250–470)
UIBC: 268 ug/dL (ref 125–400)

## 2012-02-20 LAB — FOLATE: Folate: 18.6 ng/mL

## 2012-02-20 NOTE — Progress Notes (Signed)
This office note has been dictated.

## 2012-02-20 NOTE — Patient Instructions (Signed)
Patient to follow up as instructed.   Current Outpatient Prescriptions  Medication Sig Dispense Refill  . cetirizine (ZYRTEC) 10 MG tablet Take 10 mg by mouth as needed.      . Clobazam (ONFI) 5 MG TABS Take 5 mg by mouth as directed. Take  1  Tab  In  AM  ;    3  Tabs  PM.      . levETIRAcetam (KEPPRA) 500 MG tablet Take 500 mg by mouth as directed. Take  3  Tabs  In  AM  ;   4  Tabs  In  PM.      . Multiple Vitamin (MULTIVITAMIN) tablet Take 1 tablet by mouth daily.      . pantoprazole (PROTONIX) 40 MG tablet Take 40 mg by mouth daily.      . polysaccharide iron (NIFEREX) 150 MG CAPS capsule Take 150 mg by mouth daily.            April 2013  Sunday Monday Tuesday Wednesday Thursday Friday Saturday      1   2   3   4   5   6    7   8   9   10   11   12   13    14   15   16   17    LAB MO     3:00 PM  (15 min.)  Radene Gunning  [facility]   EST PT 30   3:30 PM  (30 min.)  Laurice Record, MD  [facility] MED[facility]COL[facility] 18   19   20    21   22   23   24   25   26   27    28   29    30

## 2012-02-21 NOTE — Progress Notes (Signed)
CC:   Tracy Reeve, MD Tracy Sabal, MD  IDENTIFYING STATEMENT:  The patient is a 41 year old woman with history of leukocytosis who presents for followup.  INTERVAL HISTORY:  The patient reports that it has been a while since she has had followup with Dr. Paulino Mcpherson.  She feels that she may have borderline diabetes as she has ongoing dry mouth, numbness on the tips of her fingers and soles of the feet.  She is also constantly tired. She also reports that due to uncontrolled seizure activity, she will be seeing neurosurgery for potential brain surgery in the upcoming weeks or so.  She does report ongoing skin boils, specifically in axilla.  She also reports sinus type infections during the winter season.  Otherwise she feels well at this present time.  She continues on oral iron.  She has been extensively evaluated from hematology standpoint and JAK2 mutation and bcr/abl were negative.  Her last iron studies a year ago noted a ferritin of 40 and she has since been on Niferex.  MEDICATIONS:  Reviewed and updated.  ALLERGIES:  Flexeril, Lamictal, morphine, Percocet.  PHYSICAL EXAMINATION:  Patient is alert and oriented x3.  Vitals:  Pulse 84, blood pressure 115/66, temperature 97.5, respirations 20, weight 319 pounds.  Mouth: No thrush.  Chest:  Clear.  Abdomen:  Soft, nontender. Bowel sounds present.  Extremities:  Trace edema.  LAB DATA:  02/20/2012:  White cell count 12.1 (13.1 in 2012 and 12.3 in 2011), hemoglobin 10.7 (7.9), hematocrit 33.4, platelets 383.  Iron 13(20), TIBC 281(216), saturation 5(6), ferritin 55(40).  CMET pending.  IMPRESSION AND PLAN:  Tracy Mcpherson is a 41 year old woman with: 1. Chronic low grade leukocytosis. 2. Anemia which appears to be chronic.  Ferritin levels are low.  She is on oral iron but may benefit from IV iron.   3. History of seizures, due to undergo neurosurgical manipulation.  She follow sup in 9 months time.      ______________________________ Tracy Mcpherson, M.D. LIO/MEDQ  D:  02/20/2012  T:  02/21/2012  Job:  [id number]

## 2012-02-22 ENCOUNTER — Telehealth: Payer: Self-pay | Admitting: *Deleted

## 2012-02-22 ENCOUNTER — Other Ambulatory Visit: Payer: Self-pay | Admitting: Hematology and Oncology

## 2012-02-22 ENCOUNTER — Other Ambulatory Visit: Payer: Self-pay | Admitting: *Deleted

## 2012-02-22 NOTE — Telephone Encounter (Signed)
Spoke with pt and informed her re:  Dr. Dalene Carrow would like for pt to receive Feraheme infusion due to Ferritin level  55.   Informed pt that a scheduler will contact pt on Mon 4/22 for appt date and time.    Pt voiced understanding.

## 2012-02-22 NOTE — Telephone Encounter (Signed)
Spoke with pt and informed pt re: pt has enough iron stores as per md.   Informed pt that a scheduler will contact pt with date and time for lab a few days prior to f/u appt with md in  December.    Pt voiced understanding.

## 2012-02-27 ENCOUNTER — Telehealth: Payer: Self-pay | Admitting: Hematology and Oncology

## 2012-02-27 NOTE — Telephone Encounter (Signed)
S/w pt re appt for 4/25. °

## 2012-02-28 ENCOUNTER — Ambulatory Visit (HOSPITAL_BASED_OUTPATIENT_CLINIC_OR_DEPARTMENT_OTHER): Payer: Federal, State, Local not specified - PPO

## 2012-02-28 VITALS — BP 130/81 | HR 93 | Temp 97.9°F

## 2012-02-28 DIAGNOSIS — D539 Nutritional anemia, unspecified: Secondary | ICD-10-CM

## 2012-02-28 DIAGNOSIS — D649 Anemia, unspecified: Secondary | ICD-10-CM

## 2012-02-28 MED ORDER — SODIUM CHLORIDE 0.9 % IV SOLN
Freq: Once | INTRAVENOUS | Status: AC
  Administered 2012-02-28: 14:00:00 via INTRAVENOUS

## 2012-02-28 MED ORDER — FERUMOXYTOL INJECTION 510 MG/17 ML
1020.0000 mg | Freq: Once | INTRAVENOUS | Status: AC
  Administered 2012-02-28: 1020 mg via INTRAVENOUS
  Filled 2012-02-28: qty 34

## 2012-02-28 NOTE — Patient Instructions (Signed)
If pt gets any signs of allergic reaction, call.

## 2012-03-20 ENCOUNTER — Other Ambulatory Visit: Payer: Self-pay | Admitting: *Deleted

## 2012-03-20 ENCOUNTER — Telehealth: Payer: Self-pay | Admitting: Hematology and Oncology

## 2012-03-20 DIAGNOSIS — D539 Nutritional anemia, unspecified: Secondary | ICD-10-CM

## 2012-03-20 NOTE — Telephone Encounter (Signed)
S/w pt re appts for 12/9 and 12/12

## 2012-03-28 ENCOUNTER — Encounter (HOSPITAL_COMMUNITY): Payer: Self-pay | Admitting: *Deleted

## 2012-03-28 ENCOUNTER — Emergency Department (HOSPITAL_COMMUNITY)
Admission: EM | Admit: 2012-03-28 | Discharge: 2012-03-29 | Disposition: A | Discharge status: Home / Self Care | Payer: Federal, State, Local not specified - PPO | Attending: Emergency Medicine | Admitting: Emergency Medicine

## 2012-03-28 DIAGNOSIS — R110 Nausea: Secondary | ICD-10-CM | POA: Insufficient documentation

## 2012-03-28 DIAGNOSIS — R079 Chest pain, unspecified: Secondary | ICD-10-CM | POA: Insufficient documentation

## 2012-03-28 DIAGNOSIS — R002 Palpitations: Secondary | ICD-10-CM | POA: Insufficient documentation

## 2012-03-28 DIAGNOSIS — F411 Generalized anxiety disorder: Secondary | ICD-10-CM | POA: Insufficient documentation

## 2012-03-28 DIAGNOSIS — F419 Anxiety disorder, unspecified: Secondary | ICD-10-CM

## 2012-03-28 HISTORY — DX: Migraine, unspecified, not intractable, without status migrainosus: G43.909

## 2012-03-28 HISTORY — DX: Unspecified convulsions: R56.9

## 2012-03-28 NOTE — ED Notes (Addendum)
Per pt: pt states she is having trouble with her memory. Pt states she had a "lot of fear, heart racing, sensitive to noise, fatigue, nausea, tingling in the back of her head, feel the aura of a seizure, feeling dizzy." pt states she feels like she is "going in and out." pt reports she has been taking her seizure medications as prescribed. Pt states seizure medications "do not work." Dr. On call at [facility] recommended she come to the emergency room. Pt repots left sided chest pain as well. Pt is A & O x 4, no slurred speech, no facial drop. Pt states these feelings are similar to when she was fist dx with epilepsy. Pt crying upon assessment. Pt reports having a witnessed "partial seizure with an aura" yesterday. Pt reports having a witnessed "complex seizure."

## 2012-03-29 ENCOUNTER — Emergency Department (HOSPITAL_COMMUNITY): Payer: Federal, State, Local not specified - PPO

## 2012-03-29 LAB — POCT I-STAT, CHEM 8
BUN: 15 mg/dL (ref 6–23)
Creatinine, Ser: 1 mg/dL (ref 0.50–1.10)
Glucose, Bld: 102 mg/dL — ABNORMAL HIGH (ref 70–99)
Hemoglobin: 13.3 g/dL (ref 12.0–15.0)
Potassium: 3.9 mEq/L (ref 3.5–5.1)
TCO2: 28 mmol/L (ref 0–100)

## 2012-03-29 MED ORDER — ASPIRIN 81 MG PO CHEW
324.0000 mg | CHEWABLE_TABLET | Freq: Once | ORAL | Status: AC
  Administered 2012-03-29: 324 mg via ORAL
  Filled 2012-03-29: qty 4

## 2012-03-29 NOTE — ED Provider Notes (Signed)
Medical screening examination/treatment/procedure(s) were performed by non-physician practitioner and as supervising physician I was immediately available for consultation/collaboration.   Hanley Seamen, MD 03/29/12 [phone]

## 2012-03-29 NOTE — ED Notes (Signed)
MD at bedside. 

## 2012-03-29 NOTE — ED Notes (Signed)
Patient transported to X-ray 

## 2012-03-29 NOTE — ED Notes (Signed)
PA Dammen at bedside. 

## 2012-03-29 NOTE — ED Notes (Signed)
Pt states she has to mid-Left chest, nausea and dizziness. Pt states the chest pain comes and goes. Pt states she felt overwhelmed when she came in to the ER, but feels much better now. Pt denies anxiety at present.

## 2012-03-29 NOTE — Discharge Instructions (Signed)
You were seen and evaluated for your symptoms today. Your EKG of your heart, chest x-ray and lab tests do not show any concerning or emergent causes to your symptoms. At this time your providers feel you're able to return home and followup with your neurology specialist and primary care provider.   Anxiety and Panic Attacks Your caregiver has informed you that you are having an anxiety or panic attack. There may be many forms of this. Most of the time these attacks come suddenly and without warning. They come at any time of day, including periods of sleep, and at any time of life. They may be strong and unexplained. Although panic attacks are very scary, they are physically harmless. Sometimes the cause of your anxiety is not known. Anxiety is a protective mechanism of the body in its fight or flight mechanism. Most of these perceived danger situations are actually nonphysical situations (such as anxiety over losing a job). CAUSES  The causes of an anxiety or panic attack are many. Panic attacks may occur in otherwise healthy people given a certain set of circumstances. There may be a genetic cause for panic attacks. Some medications may also have anxiety as a side effect. SYMPTOMS  Some of the most common feelings are:  Intense terror.   Dizziness, feeling faint.   Hot and cold flashes.   Fear of going crazy.   Feelings that nothing is real.   Sweating.   Shaking.   Chest pain or a fast heartbeat (palpitations).   Smothering, choking sensations.   Feelings of impending doom and that death is near.   Tingling of extremities, this may be from over-breathing.   Altered reality (derealization).   Being detached from yourself (depersonalization).  Several symptoms can be present to make up anxiety or panic attacks. DIAGNOSIS  The evaluation by your caregiver will depend on the type of symptoms you are experiencing. The diagnosis of anxiety or panic attack is made when no physical  illness can be determined to be a cause of the symptoms. TREATMENT  Treatment to prevent anxiety and panic attacks may include:  Avoidance of circumstances that cause anxiety.   Reassurance and relaxation.   Regular exercise.   Relaxation therapies, such as yoga.   Psychotherapy with a psychiatrist or therapist.   Avoidance of caffeine, alcohol and illegal drugs.   Prescribed medication.  SEEK IMMEDIATE MEDICAL CARE IF:   You experience panic attack symptoms that are different than your usual symptoms.   You have any worsening or concerning symptoms.  Document Released: 10/22/2005 Document Revised: 10/11/2011 Document Reviewed: 02/23/2010 [facility] Patient Information 2012 [place], [place].    RESOURCE GUIDE  Dental Problems  Patients with Medicaid: [facility] [phone] [address]                                           [id number] Phone:  [phone]                                                  Phone:  [phone]  If unable to pay or uninsured, contact:  [facility] or [facility] Dept[facility] to become qualified for the adult dental clinic.  Chronic Pain Problems Contact [facility]  [phone] Patients need to be referred by their primary care doctor.  Insufficient Money for Medicine Contact [employer]:  call "211" or [facility] [phone].  No Primary Care Doctor Call [facility]  [phone] Other agencies that provide inexpensive medical care    [facility]  [phone]    [facility]  [phone]    [facility]  [phone]    [facility]  [phone]    [facility]  [phone]    [facility]  [phone]  Psychological Services [facility]  [phone] [facility] Services  [phone] [facility]   [phone] (emergency services [phone])  Substance Abuse Resources [facility]   ([phone] [facility] 223-240-1424 [facility] [phone] Floydene Flock [phone] Residential & Outpatient Substance Abuse Program  [phone]  Abuse/Neglect [facility] Child Abuse Hotline 7702967752 [facility] Child Abuse Hotline 450-151-6840 (After Hours)  Emergency Shelter [facility] 907-369-2364  Maternity Homes Room at the [place] [facility] 204-887-7173 [facility] [phone]  MRSA Hotline #:   9498151788    [facility] Resources  [facility] [place]     [employer]                          [facility] Dept. [address] [place]                       [address]      [address]  Blondell Reveal Phone:  [phone]                                   Phone:  [phone]                 Phone:  [phone]  [facility] Mental Health Phone:  [phone]  [facility] Child Abuse Hotline 2151764820 [phone] (After Hours)

## 2012-03-29 NOTE — ED Provider Notes (Signed)
History     CSN: [id number]  Arrival date & time 03/28/12  2155   First MD Initiated Contact with Patient 03/29/12 0001      Chief Complaint  Patient presents with  . Anxiety    HPI  History provided by the patient. Patient is 41 year old female with history of seizures, anemia who presents with symptoms of nausea, lightheadedness, tingling and chest pressure with occasional heart palpitations. Patient states the symptoms feel similar to prior aura symptoms for seizures.patient states that they fill a slightly more prolonged than usual and she called her on-call neurology specialist at [facility] who advised her she may need additional evaluation at the emergency room. Patient states she was told that they may be anxiety type symptoms they could not be sure over the phone. Patient does report having some increased stress related to an upcoming procedure next month. Patient states that they're planning to induce seizures and try to locate a foci in her brain and use a laser treatment to help with her biopsy. Patient states that she struggles with epilepsy despite being on medications and has one to 2 rate throughepisodes a week.  patient last had a seizure yesterday.  she denies any seizure today that she knows of.    Past Medical History  Diagnosis Date  . Seizures   . Migraines   . Gestational diabetes   . Preeclampsia     Past Surgical History  Procedure Date  . Cesarean section     History reviewed. No pertinent family history.  History  Substance Use Topics  . Smoking status: Never Smoker   . Smokeless tobacco: Never Used  . Alcohol Use: No    OB History    Grav Para Term Preterm Abortions TAB SAB Ect Mult Living                  Review of Systems  Constitutional: Negative for fever and chills.  Respiratory: Negative for shortness of breath.   Cardiovascular: Positive for chest pain and palpitations.  Gastrointestinal: Positive for nausea. Negative for  vomiting, abdominal pain, diarrhea and constipation.  Genitourinary: Negative for dysuria, frequency, hematuria and flank pain.    Allergies  Flexeril; Lamictal; Morphine and related; Percocet; and Vimpat  Home Medications   Current Outpatient Rx  Name Route Sig Dispense Refill  . CETIRIZINE HCL 10 MG PO TABS Oral Take 10 mg by mouth as needed. Allergies    . CLOBAZAM 5 MG PO TABS Oral Take 5 mg by mouth as directed. Take  1  Tab  In  AM  ;    3  Tabs  PM.    . LEVETIRACETAM 500 MG PO TABS Oral Take 500 mg by mouth as directed. Take  3  Tabs  In  AM  ;   4  Tabs  In  PM.    . ONE-DAILY MULTI VITAMINS PO TABS Oral Take 1 tablet by mouth daily.    Marland Kitchen PANTOPRAZOLE SODIUM 40 MG PO TBEC Oral Take 40 mg by mouth daily.    Marland Kitchen POLYSACCHARIDE IRON 150 MG PO CAPS Oral Take 150 mg by mouth daily.      BP 146/93  Pulse 70  Temp(Src) 97.9 F (36.6 C) (Oral)  Resp 16  Ht 5\' 7"  (1.702 m)  Wt 310 lb (140.615 kg)  BMI 48.55 kg/m2  SpO2 100%  Physical Exam  Nursing note and vitals reviewed. Constitutional: She is oriented to person, place, and time. She appears well-developed  and well-nourished. No distress.  HENT:  Head: Normocephalic.  Cardiovascular: Normal rate and regular rhythm.   No murmur heard. Pulmonary/Chest: Effort normal and breath sounds normal. No respiratory distress. She has no wheezes.  Abdominal: Soft. There is no tenderness. There is no guarding.  Neurological: She is alert and oriented to person, place, and time. She has normal strength. No cranial nerve deficit or sensory deficit.  Skin: Skin is warm and dry.  Psychiatric: She has a normal mood and affect. Her behavior is normal.    ED Course  Procedures   Labs Reviewed  POCT I-STAT, CHEM 8 - Abnormal; Notable for the following:    Glucose, Bld 102 (*)    All other components within normal limits  LAB REPORT - SCANNED   Dg Chest 2 View  03/29/2012  *RADIOLOGY REPORT*  Clinical Data: Chest pressure/tightness,  shortness of breath  CHEST - 2 VIEW  Comparison: CT chest dated 08/21/2010  Findings: Lungs are clear. No pleural effusion or pneumothorax.  The heart is top normal in size.  Visualized osseous structures are within normal limits.  IMPRESSION: No evidence of acute cardiopulmonary disease.  Original Report Authenticated By: Charline Bills, M.D.     1. Anxiety       MDM  Patient seen and evaluated. Patient in no acute distress.       Date: 03/28/2012  Rate: 68  Rhythm: normal sinus rhythm  QRS Axis: normal  Intervals: normal  ST/T Wave abnormalities: normal  Conduction Disutrbances:none  Narrative Interpretation:   Old EKG Reviewed: unchangedfrom 01/19/2010    Angus Seller, PA 03/29/12 [phone]

## 2012-10-13 ENCOUNTER — Other Ambulatory Visit: Payer: Federal, State, Local not specified - PPO | Admitting: Lab

## 2012-10-16 ENCOUNTER — Ambulatory Visit: Payer: Federal, State, Local not specified - PPO | Admitting: Hematology and Oncology

## 2012-11-03 ENCOUNTER — Telehealth: Payer: Self-pay | Admitting: Hematology and Oncology

## 2012-11-03 NOTE — Telephone Encounter (Signed)
S/w pt today re LO and leaving and reassignment. Pt was FTKA for 12/12 appt so no f/u or reschedule order given. Pt states she wants to r/s but would like to call us back when she is ready to r/s. Pt informed that was fine and when she called back she would be scheduled for f/u w/a new provider. Pt voice understanding. Pt reassigned to [facility].

## 2013-10-22 ENCOUNTER — Telehealth: Payer: Self-pay | Admitting: Internal Medicine

## 2013-10-22 NOTE — Telephone Encounter (Signed)
pt called today to r/s dec appts. pt given appt for lb/NG 12/24 @ 9:30am. pt schedule w/NG due to 1st available - pt never seen by CP1

## 2013-10-28 ENCOUNTER — Other Ambulatory Visit: Payer: Self-pay | Admitting: Hematology and Oncology

## 2013-10-28 ENCOUNTER — Telehealth: Payer: Self-pay | Admitting: Hematology and Oncology

## 2013-10-28 ENCOUNTER — Ambulatory Visit: Payer: Federal, State, Local not specified - PPO | Admitting: Hematology and Oncology

## 2013-10-28 ENCOUNTER — Other Ambulatory Visit: Payer: Federal, State, Local not specified - PPO

## 2013-10-28 DIAGNOSIS — D539 Nutritional anemia, unspecified: Secondary | ICD-10-CM

## 2013-10-28 DIAGNOSIS — D72829 Elevated white blood cell count, unspecified: Secondary | ICD-10-CM

## 2013-10-28 NOTE — Telephone Encounter (Signed)
s.w. pt called to r/s todays appt due to copay...done pt aware

## 2013-11-11 ENCOUNTER — Encounter: Payer: Self-pay | Admitting: *Deleted

## 2013-11-11 ENCOUNTER — Ambulatory Visit: Payer: Federal, State, Local not specified - PPO | Admitting: Hematology and Oncology

## 2013-11-11 ENCOUNTER — Other Ambulatory Visit: Payer: Federal, State, Local not specified - PPO

## 2014-10-08 ENCOUNTER — Encounter (HOSPITAL_BASED_OUTPATIENT_CLINIC_OR_DEPARTMENT_OTHER): Payer: Self-pay | Admitting: *Deleted

## 2014-10-13 ENCOUNTER — Encounter (HOSPITAL_BASED_OUTPATIENT_CLINIC_OR_DEPARTMENT_OTHER): Admission: RE | Payer: Self-pay | Source: Ambulatory Visit

## 2014-10-13 ENCOUNTER — Ambulatory Visit (HOSPITAL_BASED_OUTPATIENT_CLINIC_OR_DEPARTMENT_OTHER)
Admission: RE | Admit: 2014-10-13 | Payer: Federal, State, Local not specified - PPO | Source: Ambulatory Visit | Admitting: Obstetrics and Gynecology

## 2014-10-13 HISTORY — DX: Iron deficiency anemia, unspecified: D50.9

## 2014-10-13 HISTORY — DX: Personal history of diseases of the blood and blood-forming organs and certain disorders involving the immune mechanism: Z86.2

## 2014-10-13 HISTORY — DX: Personal history of other infectious and parasitic diseases: Z86.19

## 2014-10-13 HISTORY — DX: Submucous leiomyoma of uterus: D25.0

## 2014-10-13 HISTORY — DX: Personal history of other complications of pregnancy, childbirth and the puerperium: Z87.59

## 2014-10-13 HISTORY — DX: Personal history of gestational diabetes: Z86.32

## 2014-10-13 SURGERY — HYSTEROSCOPY
Anesthesia: General

## 2016-03-21 DIAGNOSIS — G40109 Localization-related (focal) (partial) symptomatic epilepsy and epileptic syndromes with simple partial seizures, not intractable, without status epilepticus: Secondary | ICD-10-CM | POA: Diagnosis not present

## 2016-05-02 DIAGNOSIS — B373 Candidiasis of vulva and vagina: Secondary | ICD-10-CM | POA: Diagnosis not present

## 2016-05-02 DIAGNOSIS — R300 Dysuria: Secondary | ICD-10-CM | POA: Diagnosis not present

## 2016-05-07 DIAGNOSIS — R635 Abnormal weight gain: Secondary | ICD-10-CM | POA: Diagnosis not present

## 2016-05-07 DIAGNOSIS — Z79899 Other long term (current) drug therapy: Secondary | ICD-10-CM | POA: Diagnosis not present

## 2016-05-07 DIAGNOSIS — R7309 Other abnormal glucose: Secondary | ICD-10-CM | POA: Diagnosis not present

## 2016-05-08 ENCOUNTER — Emergency Department (HOSPITAL_COMMUNITY): Payer: Federal, State, Local not specified - PPO

## 2016-05-08 ENCOUNTER — Encounter (HOSPITAL_COMMUNITY): Payer: Self-pay | Admitting: Emergency Medicine

## 2016-05-08 ENCOUNTER — Emergency Department (HOSPITAL_COMMUNITY)
Admission: EM | Admit: 2016-05-08 | Discharge: 2016-05-08 | Disposition: A | Discharge status: Home / Self Care | Payer: Federal, State, Local not specified - PPO | Attending: Emergency Medicine | Admitting: Emergency Medicine

## 2016-05-08 DIAGNOSIS — S0003XA Contusion of scalp, initial encounter: Secondary | ICD-10-CM | POA: Diagnosis not present

## 2016-05-08 DIAGNOSIS — Y939 Activity, unspecified: Secondary | ICD-10-CM | POA: Insufficient documentation

## 2016-05-08 DIAGNOSIS — M79645 Pain in left finger(s): Secondary | ICD-10-CM | POA: Diagnosis not present

## 2016-05-08 DIAGNOSIS — T148XXA Other injury of unspecified body region, initial encounter: Secondary | ICD-10-CM

## 2016-05-08 DIAGNOSIS — S199XXA Unspecified injury of neck, initial encounter: Secondary | ICD-10-CM | POA: Diagnosis not present

## 2016-05-08 DIAGNOSIS — T7491XA Unspecified adult maltreatment, confirmed, initial encounter: Secondary | ICD-10-CM

## 2016-05-08 DIAGNOSIS — Y929 Unspecified place or not applicable: Secondary | ICD-10-CM | POA: Diagnosis not present

## 2016-05-08 DIAGNOSIS — S0990XA Unspecified injury of head, initial encounter: Secondary | ICD-10-CM

## 2016-05-08 DIAGNOSIS — Y999 Unspecified external cause status: Secondary | ICD-10-CM | POA: Insufficient documentation

## 2016-05-08 DIAGNOSIS — S161XXA Strain of muscle, fascia and tendon at neck level, initial encounter: Secondary | ICD-10-CM | POA: Insufficient documentation

## 2016-05-08 DIAGNOSIS — R51 Headache: Secondary | ICD-10-CM | POA: Diagnosis not present

## 2016-05-08 DIAGNOSIS — T7411XA Adult physical abuse, confirmed, initial encounter: Secondary | ICD-10-CM | POA: Diagnosis not present

## 2016-05-08 DIAGNOSIS — S6992XA Unspecified injury of left wrist, hand and finger(s), initial encounter: Secondary | ICD-10-CM | POA: Diagnosis not present

## 2016-05-08 HISTORY — DX: Obesity, unspecified: E66.9

## 2016-05-08 MED ORDER — HYDROCODONE-ACETAMINOPHEN 5-325 MG PO TABS: 1.0000 | ORAL_TABLET | ORAL | Status: DC | PRN

## 2016-05-08 MED ORDER — METHOCARBAMOL 500 MG PO TABS: 500.0000 mg | ORAL_TABLET | Freq: Two times a day (BID) | ORAL | Status: DC

## 2016-05-08 MED ORDER — HYDROCODONE-ACETAMINOPHEN 5-325 MG PO TABS
1.0000 | ORAL_TABLET | Freq: Once | ORAL | Status: AC
  Administered 2016-05-08: 1 via ORAL
  Filled 2016-05-08: qty 1

## 2016-05-08 NOTE — ED Notes (Signed)
Pt stable, ambulatory, states understanding of discharge instructions 
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
## 2016-05-15 DIAGNOSIS — G40909 Epilepsy, unspecified, not intractable, without status epilepticus: Secondary | ICD-10-CM | POA: Diagnosis not present

## 2016-05-15 DIAGNOSIS — Z9889 Other specified postprocedural states: Secondary | ICD-10-CM | POA: Diagnosis not present

## 2016-05-15 DIAGNOSIS — G40219 Localization-related (focal) (partial) symptomatic epilepsy and epileptic syndromes with complex partial seizures, intractable, without status epilepticus: Secondary | ICD-10-CM | POA: Diagnosis not present

## 2016-05-15 DIAGNOSIS — Z9989 Dependence on other enabling machines and devices: Secondary | ICD-10-CM | POA: Diagnosis not present

## 2016-05-15 DIAGNOSIS — T7411XS Adult physical abuse, confirmed, sequela: Secondary | ICD-10-CM | POA: Diagnosis not present

## 2016-05-15 DIAGNOSIS — G4733 Obstructive sleep apnea (adult) (pediatric): Secondary | ICD-10-CM | POA: Diagnosis not present

## 2016-05-15 DIAGNOSIS — F329 Major depressive disorder, single episode, unspecified: Secondary | ICD-10-CM | POA: Diagnosis not present

## 2016-05-15 DIAGNOSIS — G40119 Localization-related (focal) (partial) symptomatic epilepsy and epileptic syndromes with simple partial seizures, intractable, without status epilepticus: Secondary | ICD-10-CM | POA: Diagnosis not present

## 2016-05-15 DIAGNOSIS — R404 Transient alteration of awareness: Secondary | ICD-10-CM | POA: Diagnosis not present

## 2016-05-15 DIAGNOSIS — Z6841 Body Mass Index (BMI) 40.0 and over, adult: Secondary | ICD-10-CM | POA: Diagnosis not present

## 2016-05-15 DIAGNOSIS — F419 Anxiety disorder, unspecified: Secondary | ICD-10-CM | POA: Diagnosis not present

## 2016-05-15 DIAGNOSIS — G43019 Migraine without aura, intractable, without status migrainosus: Secondary | ICD-10-CM | POA: Diagnosis not present

## 2016-05-15 DIAGNOSIS — Z885 Allergy status to narcotic agent status: Secondary | ICD-10-CM | POA: Diagnosis not present

## 2016-05-15 DIAGNOSIS — S0990XS Unspecified injury of head, sequela: Secondary | ICD-10-CM | POA: Diagnosis not present

## 2016-05-15 DIAGNOSIS — Z888 Allergy status to other drugs, medicaments and biological substances status: Secondary | ICD-10-CM | POA: Diagnosis not present

## 2016-05-15 DIAGNOSIS — F418 Other specified anxiety disorders: Secondary | ICD-10-CM | POA: Diagnosis not present

## 2016-05-15 DIAGNOSIS — F4489 Other dissociative and conversion disorders: Secondary | ICD-10-CM | POA: Diagnosis not present

## 2016-05-16 DIAGNOSIS — G40219 Localization-related (focal) (partial) symptomatic epilepsy and epileptic syndromes with complex partial seizures, intractable, without status epilepticus: Secondary | ICD-10-CM | POA: Diagnosis not present

## 2016-05-16 DIAGNOSIS — R404 Transient alteration of awareness: Secondary | ICD-10-CM | POA: Diagnosis not present

## 2016-05-16 DIAGNOSIS — F4489 Other dissociative and conversion disorders: Secondary | ICD-10-CM | POA: Diagnosis not present

## 2016-05-17 DIAGNOSIS — F4489 Other dissociative and conversion disorders: Secondary | ICD-10-CM | POA: Diagnosis not present

## 2016-05-17 DIAGNOSIS — F418 Other specified anxiety disorders: Secondary | ICD-10-CM | POA: Diagnosis not present

## 2016-05-17 DIAGNOSIS — R404 Transient alteration of awareness: Secondary | ICD-10-CM | POA: Diagnosis not present

## 2016-05-17 DIAGNOSIS — G40219 Localization-related (focal) (partial) symptomatic epilepsy and epileptic syndromes with complex partial seizures, intractable, without status epilepticus: Secondary | ICD-10-CM | POA: Diagnosis not present

## 2016-06-01 DIAGNOSIS — G4733 Obstructive sleep apnea (adult) (pediatric): Secondary | ICD-10-CM | POA: Diagnosis not present

## 2016-07-16 DIAGNOSIS — Z01419 Encounter for gynecological examination (general) (routine) without abnormal findings: Secondary | ICD-10-CM | POA: Diagnosis not present

## 2016-07-16 DIAGNOSIS — N898 Other specified noninflammatory disorders of vagina: Secondary | ICD-10-CM | POA: Diagnosis not present

## 2016-07-16 DIAGNOSIS — Z6841 Body Mass Index (BMI) 40.0 and over, adult: Secondary | ICD-10-CM | POA: Diagnosis not present

## 2016-07-16 DIAGNOSIS — Z1151 Encounter for screening for human papillomavirus (HPV): Secondary | ICD-10-CM | POA: Diagnosis not present

## 2016-07-16 DIAGNOSIS — Z3202 Encounter for pregnancy test, result negative: Secondary | ICD-10-CM | POA: Diagnosis not present

## 2016-07-16 DIAGNOSIS — N912 Amenorrhea, unspecified: Secondary | ICD-10-CM | POA: Diagnosis not present

## 2016-07-25 DIAGNOSIS — N912 Amenorrhea, unspecified: Secondary | ICD-10-CM | POA: Diagnosis not present

## 2016-07-25 DIAGNOSIS — Z131 Encounter for screening for diabetes mellitus: Secondary | ICD-10-CM | POA: Diagnosis not present

## 2016-07-25 DIAGNOSIS — Z13220 Encounter for screening for lipoid disorders: Secondary | ICD-10-CM | POA: Diagnosis not present

## 2016-07-25 DIAGNOSIS — Z1329 Encounter for screening for other suspected endocrine disorder: Secondary | ICD-10-CM | POA: Diagnosis not present

## 2016-07-25 DIAGNOSIS — Z0000 Encounter for general adult medical examination without abnormal findings: Secondary | ICD-10-CM | POA: Diagnosis not present

## 2016-07-25 DIAGNOSIS — Z130 Encounter for screening for diseases of the blood and blood-forming organs and certain disorders involving the immune mechanism: Secondary | ICD-10-CM | POA: Diagnosis not present

## 2016-08-28 DIAGNOSIS — R7989 Other specified abnormal findings of blood chemistry: Secondary | ICD-10-CM | POA: Diagnosis not present

## 2016-08-29 DIAGNOSIS — F419 Anxiety disorder, unspecified: Secondary | ICD-10-CM | POA: Diagnosis not present

## 2016-08-30 DIAGNOSIS — F419 Anxiety disorder, unspecified: Secondary | ICD-10-CM | POA: Diagnosis not present

## 2016-09-12 DIAGNOSIS — F419 Anxiety disorder, unspecified: Secondary | ICD-10-CM | POA: Diagnosis not present

## 2016-09-18 DIAGNOSIS — Z79899 Other long term (current) drug therapy: Secondary | ICD-10-CM | POA: Diagnosis not present

## 2016-09-18 DIAGNOSIS — G40109 Localization-related (focal) (partial) symptomatic epilepsy and epileptic syndromes with simple partial seizures, not intractable, without status epilepticus: Secondary | ICD-10-CM | POA: Diagnosis not present

## 2016-09-18 DIAGNOSIS — R413 Other amnesia: Secondary | ICD-10-CM | POA: Diagnosis not present

## 2016-09-18 DIAGNOSIS — R51 Headache: Secondary | ICD-10-CM | POA: Diagnosis not present

## 2016-09-18 DIAGNOSIS — Z9989 Dependence on other enabling machines and devices: Secondary | ICD-10-CM | POA: Diagnosis not present

## 2016-09-18 DIAGNOSIS — G4733 Obstructive sleep apnea (adult) (pediatric): Secondary | ICD-10-CM | POA: Diagnosis not present

## 2016-09-18 DIAGNOSIS — Z9889 Other specified postprocedural states: Secondary | ICD-10-CM | POA: Diagnosis not present

## 2016-09-18 DIAGNOSIS — R5383 Other fatigue: Secondary | ICD-10-CM | POA: Diagnosis not present

## 2016-09-25 DIAGNOSIS — F419 Anxiety disorder, unspecified: Secondary | ICD-10-CM | POA: Diagnosis not present

## 2016-09-26 DIAGNOSIS — F419 Anxiety disorder, unspecified: Secondary | ICD-10-CM | POA: Diagnosis not present

## 2016-11-24 DIAGNOSIS — M9903 Segmental and somatic dysfunction of lumbar region: Secondary | ICD-10-CM | POA: Diagnosis not present

## 2016-11-24 DIAGNOSIS — M9902 Segmental and somatic dysfunction of thoracic region: Secondary | ICD-10-CM | POA: Diagnosis not present

## 2016-11-24 DIAGNOSIS — M791 Myalgia: Secondary | ICD-10-CM | POA: Diagnosis not present

## 2016-11-24 DIAGNOSIS — M9901 Segmental and somatic dysfunction of cervical region: Secondary | ICD-10-CM | POA: Diagnosis not present

## 2016-11-24 DIAGNOSIS — M9905 Segmental and somatic dysfunction of pelvic region: Secondary | ICD-10-CM | POA: Diagnosis not present

## 2016-11-26 DIAGNOSIS — M9905 Segmental and somatic dysfunction of pelvic region: Secondary | ICD-10-CM | POA: Diagnosis not present

## 2016-11-26 DIAGNOSIS — M9902 Segmental and somatic dysfunction of thoracic region: Secondary | ICD-10-CM | POA: Diagnosis not present

## 2016-11-26 DIAGNOSIS — M791 Myalgia: Secondary | ICD-10-CM | POA: Diagnosis not present

## 2016-11-26 DIAGNOSIS — M9901 Segmental and somatic dysfunction of cervical region: Secondary | ICD-10-CM | POA: Diagnosis not present

## 2016-11-26 DIAGNOSIS — M9903 Segmental and somatic dysfunction of lumbar region: Secondary | ICD-10-CM | POA: Diagnosis not present

## 2016-12-10 DIAGNOSIS — M9903 Segmental and somatic dysfunction of lumbar region: Secondary | ICD-10-CM | POA: Diagnosis not present

## 2016-12-10 DIAGNOSIS — M9905 Segmental and somatic dysfunction of pelvic region: Secondary | ICD-10-CM | POA: Diagnosis not present

## 2016-12-10 DIAGNOSIS — M791 Myalgia: Secondary | ICD-10-CM | POA: Diagnosis not present

## 2016-12-10 DIAGNOSIS — M9902 Segmental and somatic dysfunction of thoracic region: Secondary | ICD-10-CM | POA: Diagnosis not present

## 2016-12-10 DIAGNOSIS — M9901 Segmental and somatic dysfunction of cervical region: Secondary | ICD-10-CM | POA: Diagnosis not present

## 2016-12-14 DIAGNOSIS — G4733 Obstructive sleep apnea (adult) (pediatric): Secondary | ICD-10-CM | POA: Diagnosis not present

## 2016-12-17 DIAGNOSIS — M9901 Segmental and somatic dysfunction of cervical region: Secondary | ICD-10-CM | POA: Diagnosis not present

## 2016-12-17 DIAGNOSIS — M791 Myalgia: Secondary | ICD-10-CM | POA: Diagnosis not present

## 2016-12-17 DIAGNOSIS — M9902 Segmental and somatic dysfunction of thoracic region: Secondary | ICD-10-CM | POA: Diagnosis not present

## 2016-12-17 DIAGNOSIS — M9905 Segmental and somatic dysfunction of pelvic region: Secondary | ICD-10-CM | POA: Diagnosis not present

## 2016-12-17 DIAGNOSIS — M9903 Segmental and somatic dysfunction of lumbar region: Secondary | ICD-10-CM | POA: Diagnosis not present

## 2016-12-26 DIAGNOSIS — M9905 Segmental and somatic dysfunction of pelvic region: Secondary | ICD-10-CM | POA: Diagnosis not present

## 2016-12-26 DIAGNOSIS — M9902 Segmental and somatic dysfunction of thoracic region: Secondary | ICD-10-CM | POA: Diagnosis not present

## 2016-12-26 DIAGNOSIS — M9901 Segmental and somatic dysfunction of cervical region: Secondary | ICD-10-CM | POA: Diagnosis not present

## 2016-12-26 DIAGNOSIS — M9903 Segmental and somatic dysfunction of lumbar region: Secondary | ICD-10-CM | POA: Diagnosis not present

## 2016-12-26 DIAGNOSIS — M791 Myalgia: Secondary | ICD-10-CM | POA: Diagnosis not present

## 2016-12-27 DIAGNOSIS — H5347 Heteronymous bilateral field defects: Secondary | ICD-10-CM | POA: Diagnosis not present

## 2016-12-27 DIAGNOSIS — H5213 Myopia, bilateral: Secondary | ICD-10-CM | POA: Diagnosis not present

## 2017-01-02 DIAGNOSIS — Z1231 Encounter for screening mammogram for malignant neoplasm of breast: Secondary | ICD-10-CM | POA: Diagnosis not present

## 2017-02-25 DIAGNOSIS — J0110 Acute frontal sinusitis, unspecified: Secondary | ICD-10-CM | POA: Diagnosis not present

## 2017-04-23 DIAGNOSIS — R413 Other amnesia: Secondary | ICD-10-CM | POA: Diagnosis not present

## 2017-04-23 DIAGNOSIS — G40109 Localization-related (focal) (partial) symptomatic epilepsy and epileptic syndromes with simple partial seizures, not intractable, without status epilepticus: Secondary | ICD-10-CM | POA: Diagnosis not present

## 2017-04-23 DIAGNOSIS — G4733 Obstructive sleep apnea (adult) (pediatric): Secondary | ICD-10-CM | POA: Diagnosis not present

## 2017-04-23 DIAGNOSIS — Z79899 Other long term (current) drug therapy: Secondary | ICD-10-CM | POA: Diagnosis not present

## 2017-04-23 DIAGNOSIS — Z9889 Other specified postprocedural states: Secondary | ICD-10-CM | POA: Diagnosis not present

## 2017-04-23 DIAGNOSIS — Z9989 Dependence on other enabling machines and devices: Secondary | ICD-10-CM | POA: Diagnosis not present

## 2017-05-22 ENCOUNTER — Encounter (HOSPITAL_COMMUNITY): Payer: Self-pay | Admitting: Emergency Medicine

## 2017-05-22 DIAGNOSIS — Y92012 Bathroom of single-family (private) house as the place of occurrence of the external cause: Secondary | ICD-10-CM | POA: Diagnosis not present

## 2017-05-22 DIAGNOSIS — T7411XA Adult physical abuse, confirmed, initial encounter: Secondary | ICD-10-CM | POA: Diagnosis not present

## 2017-05-22 DIAGNOSIS — S199XXA Unspecified injury of neck, initial encounter: Secondary | ICD-10-CM | POA: Diagnosis not present

## 2017-05-22 DIAGNOSIS — S0083XA Contusion of other part of head, initial encounter: Secondary | ICD-10-CM | POA: Insufficient documentation

## 2017-05-22 DIAGNOSIS — M79642 Pain in left hand: Secondary | ICD-10-CM | POA: Diagnosis not present

## 2017-05-22 DIAGNOSIS — Y998 Other external cause status: Secondary | ICD-10-CM | POA: Diagnosis not present

## 2017-05-22 DIAGNOSIS — Z79899 Other long term (current) drug therapy: Secondary | ICD-10-CM | POA: Insufficient documentation

## 2017-05-22 DIAGNOSIS — S0990XA Unspecified injury of head, initial encounter: Secondary | ICD-10-CM | POA: Diagnosis not present

## 2017-05-22 DIAGNOSIS — Y9389 Activity, other specified: Secondary | ICD-10-CM | POA: Insufficient documentation

## 2017-05-22 DIAGNOSIS — Z23 Encounter for immunization: Secondary | ICD-10-CM | POA: Insufficient documentation

## 2017-05-22 DIAGNOSIS — S0993XA Unspecified injury of face, initial encounter: Secondary | ICD-10-CM | POA: Diagnosis not present

## 2017-05-22 DIAGNOSIS — W208XXA Other cause of strike by thrown, projected or falling object, initial encounter: Secondary | ICD-10-CM | POA: Insufficient documentation
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
## 2017-05-23 ENCOUNTER — Emergency Department (HOSPITAL_COMMUNITY): Payer: Federal, State, Local not specified - PPO

## 2017-05-23 ENCOUNTER — Emergency Department (HOSPITAL_COMMUNITY)
Admission: EM | Admit: 2017-05-23 | Discharge: 2017-05-23 | Disposition: A | Discharge status: Home / Self Care | Payer: Federal, State, Local not specified - PPO | Attending: Emergency Medicine | Admitting: Emergency Medicine

## 2017-05-23 ENCOUNTER — Encounter (HOSPITAL_COMMUNITY): Payer: Self-pay | Admitting: *Deleted

## 2017-05-23 DIAGNOSIS — S0990XA Unspecified injury of head, initial encounter: Secondary | ICD-10-CM

## 2017-05-23 DIAGNOSIS — S0993XA Unspecified injury of face, initial encounter: Secondary | ICD-10-CM | POA: Diagnosis not present

## 2017-05-23 DIAGNOSIS — M79642 Pain in left hand: Secondary | ICD-10-CM | POA: Diagnosis not present

## 2017-05-23 DIAGNOSIS — S199XXA Unspecified injury of neck, initial encounter: Secondary | ICD-10-CM | POA: Diagnosis not present

## 2017-05-23 DIAGNOSIS — S0083XA Contusion of other part of head, initial encounter: Secondary | ICD-10-CM

## 2017-05-23 DIAGNOSIS — T148XXA Other injury of unspecified body region, initial encounter: Secondary | ICD-10-CM

## 2017-05-23 DIAGNOSIS — T7491XA Unspecified adult maltreatment, confirmed, initial encounter: Secondary | ICD-10-CM

## 2017-05-23 MED ORDER — ACETAMINOPHEN 500 MG PO TABS
1000.0000 mg | ORAL_TABLET | Freq: Once | ORAL | Status: AC
  Administered 2017-05-23: 1000 mg via ORAL
  Filled 2017-05-23: qty 2

## 2017-05-23 MED ORDER — TETANUS-DIPHTH-ACELL PERTUSSIS 5-2.5-18.5 LF-MCG/0.5 IM SUSP
0.5000 mL | Freq: Once | INTRAMUSCULAR | Status: AC
  Administered 2017-05-23: 0.5 mL via INTRAMUSCULAR
  Filled 2017-05-23: qty 0.5

## 2017-05-23 NOTE — Discharge Instructions (Signed)
You may alternate Tylenol 1000 mg every 6 hours as needed for pain and Ibuprofen 800 mg every 8 hours as needed for pain.  Please take Ibuprofen with food.   Please avoid alcohol for the next week.  Please rest and drink plenty of water.  We recommend that you avoid any activity that may lead to another head injury for at least 1 week or until your symptoms have completely resolved.  We also recommend "brain rest" - please avoid TV, cell phones, tablets, computers as much as possible for the next 48 hours.
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
## 2017-06-03 DIAGNOSIS — S060X9A Concussion with loss of consciousness of unspecified duration, initial encounter: Secondary | ICD-10-CM | POA: Diagnosis not present

## 2017-06-04 DIAGNOSIS — F419 Anxiety disorder, unspecified: Secondary | ICD-10-CM | POA: Diagnosis not present

## 2017-06-05 DIAGNOSIS — F419 Anxiety disorder, unspecified: Secondary | ICD-10-CM | POA: Diagnosis not present

## 2017-06-18 DIAGNOSIS — F419 Anxiety disorder, unspecified: Secondary | ICD-10-CM | POA: Diagnosis not present

## 2017-06-19 DIAGNOSIS — F419 Anxiety disorder, unspecified: Secondary | ICD-10-CM | POA: Diagnosis not present

## 2017-07-02 DIAGNOSIS — F419 Anxiety disorder, unspecified: Secondary | ICD-10-CM | POA: Diagnosis not present

## 2017-07-03 DIAGNOSIS — F419 Anxiety disorder, unspecified: Secondary | ICD-10-CM | POA: Diagnosis not present

## 2017-07-23 DIAGNOSIS — N898 Other specified noninflammatory disorders of vagina: Secondary | ICD-10-CM | POA: Diagnosis not present

## 2017-07-23 DIAGNOSIS — Z3200 Encounter for pregnancy test, result unknown: Secondary | ICD-10-CM | POA: Diagnosis not present

## 2017-07-23 DIAGNOSIS — R32 Unspecified urinary incontinence: Secondary | ICD-10-CM | POA: Diagnosis not present

## 2017-07-23 DIAGNOSIS — Z6841 Body Mass Index (BMI) 40.0 and over, adult: Secondary | ICD-10-CM | POA: Diagnosis not present

## 2017-07-23 DIAGNOSIS — Z01419 Encounter for gynecological examination (general) (routine) without abnormal findings: Secondary | ICD-10-CM | POA: Diagnosis not present

## 2017-07-23 DIAGNOSIS — Z0000 Encounter for general adult medical examination without abnormal findings: Secondary | ICD-10-CM | POA: Diagnosis not present

## 2017-07-23 DIAGNOSIS — R109 Unspecified abdominal pain: Secondary | ICD-10-CM | POA: Diagnosis not present

## 2017-07-23 DIAGNOSIS — Z131 Encounter for screening for diabetes mellitus: Secondary | ICD-10-CM | POA: Diagnosis not present

## 2017-07-23 DIAGNOSIS — Z130 Encounter for screening for diseases of the blood and blood-forming organs and certain disorders involving the immune mechanism: Secondary | ICD-10-CM | POA: Diagnosis not present

## 2017-07-23 DIAGNOSIS — Z13220 Encounter for screening for lipoid disorders: Secondary | ICD-10-CM | POA: Diagnosis not present

## 2017-07-23 DIAGNOSIS — Z1329 Encounter for screening for other suspected endocrine disorder: Secondary | ICD-10-CM | POA: Diagnosis not present

## 2017-07-23 DIAGNOSIS — R350 Frequency of micturition: Secondary | ICD-10-CM | POA: Diagnosis not present

## 2017-07-25 DIAGNOSIS — Z9889 Other specified postprocedural states: Secondary | ICD-10-CM | POA: Diagnosis not present

## 2017-07-25 DIAGNOSIS — G9389 Other specified disorders of brain: Secondary | ICD-10-CM | POA: Diagnosis not present

## 2017-07-25 DIAGNOSIS — J3489 Other specified disorders of nose and nasal sinuses: Secondary | ICD-10-CM | POA: Diagnosis not present

## 2017-07-25 DIAGNOSIS — R9082 White matter disease, unspecified: Secondary | ICD-10-CM | POA: Diagnosis not present

## 2017-07-25 DIAGNOSIS — X58XXXA Exposure to other specified factors, initial encounter: Secondary | ICD-10-CM | POA: Diagnosis not present

## 2017-07-25 DIAGNOSIS — S098XXA Other specified injuries of head, initial encounter: Secondary | ICD-10-CM | POA: Diagnosis not present

## 2017-07-30 DIAGNOSIS — F419 Anxiety disorder, unspecified: Secondary | ICD-10-CM | POA: Diagnosis not present

## 2017-07-31 DIAGNOSIS — F419 Anxiety disorder, unspecified: Secondary | ICD-10-CM | POA: Diagnosis not present

## 2017-08-27 DIAGNOSIS — R350 Frequency of micturition: Secondary | ICD-10-CM | POA: Diagnosis not present

## 2017-10-03 DIAGNOSIS — F331 Major depressive disorder, recurrent, moderate: Secondary | ICD-10-CM | POA: Diagnosis not present

## 2017-10-03 DIAGNOSIS — F4312 Post-traumatic stress disorder, chronic: Secondary | ICD-10-CM | POA: Diagnosis not present

## 2017-10-10 DIAGNOSIS — Z0000 Encounter for general adult medical examination without abnormal findings: Secondary | ICD-10-CM | POA: Diagnosis not present

## 2017-10-10 DIAGNOSIS — R635 Abnormal weight gain: Secondary | ICD-10-CM | POA: Diagnosis not present

## 2017-10-10 DIAGNOSIS — Z13220 Encounter for screening for lipoid disorders: Secondary | ICD-10-CM | POA: Diagnosis not present

## 2017-10-10 DIAGNOSIS — N3941 Urge incontinence: Secondary | ICD-10-CM | POA: Diagnosis not present

## 2017-10-11 DIAGNOSIS — D649 Anemia, unspecified: Secondary | ICD-10-CM | POA: Diagnosis not present

## 2017-10-21 DIAGNOSIS — F419 Anxiety disorder, unspecified: Secondary | ICD-10-CM | POA: Diagnosis not present

## 2017-10-21 DIAGNOSIS — G4733 Obstructive sleep apnea (adult) (pediatric): Secondary | ICD-10-CM | POA: Diagnosis not present

## 2017-10-21 DIAGNOSIS — G40219 Localization-related (focal) (partial) symptomatic epilepsy and epileptic syndromes with complex partial seizures, intractable, without status epilepticus: Secondary | ICD-10-CM | POA: Diagnosis not present

## 2017-10-21 DIAGNOSIS — G43019 Migraine without aura, intractable, without status migrainosus: Secondary | ICD-10-CM | POA: Diagnosis not present

## 2017-11-26 DIAGNOSIS — Z111 Encounter for screening for respiratory tuberculosis: Secondary | ICD-10-CM | POA: Diagnosis not present

## 2017-12-05 DIAGNOSIS — B349 Viral infection, unspecified: Secondary | ICD-10-CM | POA: Diagnosis not present

## 2017-12-19 DIAGNOSIS — R42 Dizziness and giddiness: Secondary | ICD-10-CM | POA: Diagnosis not present

## 2017-12-19 DIAGNOSIS — R002 Palpitations: Secondary | ICD-10-CM | POA: Diagnosis not present

## 2017-12-19 DIAGNOSIS — D649 Anemia, unspecified: Secondary | ICD-10-CM | POA: Diagnosis not present

## 2017-12-19 DIAGNOSIS — R0789 Other chest pain: Secondary | ICD-10-CM | POA: Diagnosis not present

## 2017-12-19 DIAGNOSIS — E538 Deficiency of other specified B group vitamins: Secondary | ICD-10-CM | POA: Diagnosis not present

## 2017-12-19 DIAGNOSIS — R635 Abnormal weight gain: Secondary | ICD-10-CM | POA: Diagnosis not present

## 2017-12-19 DIAGNOSIS — R7303 Prediabetes: Secondary | ICD-10-CM | POA: Diagnosis not present

## 2017-12-25 DIAGNOSIS — R0600 Dyspnea, unspecified: Secondary | ICD-10-CM | POA: Diagnosis not present

## 2017-12-25 DIAGNOSIS — J4540 Moderate persistent asthma, uncomplicated: Secondary | ICD-10-CM | POA: Diagnosis not present

## 2017-12-25 DIAGNOSIS — R0789 Other chest pain: Secondary | ICD-10-CM | POA: Diagnosis not present

## 2018-01-01 DIAGNOSIS — E538 Deficiency of other specified B group vitamins: Secondary | ICD-10-CM | POA: Diagnosis not present

## 2018-01-15 DIAGNOSIS — E538 Deficiency of other specified B group vitamins: Secondary | ICD-10-CM | POA: Diagnosis not present

## 2018-01-29 DIAGNOSIS — M9903 Segmental and somatic dysfunction of lumbar region: Secondary | ICD-10-CM | POA: Diagnosis not present

## 2018-01-29 DIAGNOSIS — R293 Abnormal posture: Secondary | ICD-10-CM | POA: Diagnosis not present

## 2018-01-29 DIAGNOSIS — M2560 Stiffness of unspecified joint, not elsewhere classified: Secondary | ICD-10-CM | POA: Diagnosis not present

## 2018-01-29 DIAGNOSIS — M545 Low back pain: Secondary | ICD-10-CM | POA: Diagnosis not present

## 2018-02-19 DIAGNOSIS — M545 Low back pain: Secondary | ICD-10-CM | POA: Diagnosis not present

## 2018-02-19 DIAGNOSIS — M9903 Segmental and somatic dysfunction of lumbar region: Secondary | ICD-10-CM | POA: Diagnosis not present

## 2018-02-19 DIAGNOSIS — R293 Abnormal posture: Secondary | ICD-10-CM | POA: Diagnosis not present

## 2018-02-19 DIAGNOSIS — M2560 Stiffness of unspecified joint, not elsewhere classified: Secondary | ICD-10-CM | POA: Diagnosis not present

## 2018-03-17 DIAGNOSIS — G4733 Obstructive sleep apnea (adult) (pediatric): Secondary | ICD-10-CM | POA: Diagnosis not present

## 2018-03-17 DIAGNOSIS — Z6841 Body Mass Index (BMI) 40.0 and over, adult: Secondary | ICD-10-CM | POA: Diagnosis not present

## 2018-04-29 DIAGNOSIS — Z713 Dietary counseling and surveillance: Secondary | ICD-10-CM | POA: Diagnosis not present

## 2018-04-29 DIAGNOSIS — Z6841 Body Mass Index (BMI) 40.0 and over, adult: Secondary | ICD-10-CM | POA: Diagnosis not present

## 2018-05-19 DIAGNOSIS — F418 Other specified anxiety disorders: Secondary | ICD-10-CM | POA: Diagnosis not present

## 2018-07-25 DIAGNOSIS — Z01419 Encounter for gynecological examination (general) (routine) without abnormal findings: Secondary | ICD-10-CM | POA: Diagnosis not present

## 2018-07-25 DIAGNOSIS — Z1231 Encounter for screening mammogram for malignant neoplasm of breast: Secondary | ICD-10-CM | POA: Diagnosis not present

## 2018-07-25 DIAGNOSIS — Z6841 Body Mass Index (BMI) 40.0 and over, adult: Secondary | ICD-10-CM | POA: Diagnosis not present

## 2018-07-25 DIAGNOSIS — R350 Frequency of micturition: Secondary | ICD-10-CM | POA: Diagnosis not present

## 2018-07-25 DIAGNOSIS — N3941 Urge incontinence: Secondary | ICD-10-CM | POA: Diagnosis not present

## 2018-07-30 ENCOUNTER — Other Ambulatory Visit: Payer: Self-pay | Admitting: Obstetrics

## 2018-07-30 DIAGNOSIS — N6489 Other specified disorders of breast: Secondary | ICD-10-CM

## 2018-08-07 DIAGNOSIS — R922 Inconclusive mammogram: Secondary | ICD-10-CM | POA: Diagnosis not present

## 2018-09-11 DIAGNOSIS — G4733 Obstructive sleep apnea (adult) (pediatric): Secondary | ICD-10-CM | POA: Diagnosis not present

## 2018-09-11 DIAGNOSIS — G40219 Localization-related (focal) (partial) symptomatic epilepsy and epileptic syndromes with complex partial seizures, intractable, without status epilepticus: Secondary | ICD-10-CM | POA: Diagnosis not present

## 2018-09-11 DIAGNOSIS — Z79899 Other long term (current) drug therapy: Secondary | ICD-10-CM | POA: Diagnosis not present

## 2018-09-11 DIAGNOSIS — R413 Other amnesia: Secondary | ICD-10-CM | POA: Diagnosis not present

## 2018-09-11 DIAGNOSIS — Z9989 Dependence on other enabling machines and devices: Secondary | ICD-10-CM | POA: Diagnosis not present

## 2018-09-11 DIAGNOSIS — G43019 Migraine without aura, intractable, without status migrainosus: Secondary | ICD-10-CM | POA: Diagnosis not present

## 2018-09-11 DIAGNOSIS — Z9009 Acquired absence of other part of head and neck: Secondary | ICD-10-CM | POA: Diagnosis not present

## 2018-09-29 DIAGNOSIS — M9902 Segmental and somatic dysfunction of thoracic region: Secondary | ICD-10-CM | POA: Diagnosis not present

## 2018-09-29 DIAGNOSIS — M9903 Segmental and somatic dysfunction of lumbar region: Secondary | ICD-10-CM | POA: Diagnosis not present

## 2018-09-29 DIAGNOSIS — M9901 Segmental and somatic dysfunction of cervical region: Secondary | ICD-10-CM | POA: Diagnosis not present

## 2018-09-29 DIAGNOSIS — M9905 Segmental and somatic dysfunction of pelvic region: Secondary | ICD-10-CM | POA: Diagnosis not present

## 2019-03-06 DIAGNOSIS — K219 Gastro-esophageal reflux disease without esophagitis: Secondary | ICD-10-CM | POA: Diagnosis not present

## 2019-03-06 DIAGNOSIS — J309 Allergic rhinitis, unspecified: Secondary | ICD-10-CM | POA: Diagnosis not present

## 2019-03-06 DIAGNOSIS — J3489 Other specified disorders of nose and nasal sinuses: Secondary | ICD-10-CM | POA: Diagnosis not present

## 2019-03-16 DIAGNOSIS — M9901 Segmental and somatic dysfunction of cervical region: Secondary | ICD-10-CM | POA: Diagnosis not present

## 2019-03-16 DIAGNOSIS — M9903 Segmental and somatic dysfunction of lumbar region: Secondary | ICD-10-CM | POA: Diagnosis not present

## 2019-03-16 DIAGNOSIS — M9902 Segmental and somatic dysfunction of thoracic region: Secondary | ICD-10-CM | POA: Diagnosis not present

## 2019-03-16 DIAGNOSIS — M9905 Segmental and somatic dysfunction of pelvic region: Secondary | ICD-10-CM | POA: Diagnosis not present

## 2019-04-08 DIAGNOSIS — N8110 Cystocele, unspecified: Secondary | ICD-10-CM | POA: Diagnosis not present

## 2019-04-10 DIAGNOSIS — Z6841 Body Mass Index (BMI) 40.0 and over, adult: Secondary | ICD-10-CM | POA: Diagnosis not present

## 2019-04-27 DIAGNOSIS — Z7689 Persons encountering health services in other specified circumstances: Secondary | ICD-10-CM | POA: Diagnosis not present

## 2019-04-27 DIAGNOSIS — Z6841 Body Mass Index (BMI) 40.0 and over, adult: Secondary | ICD-10-CM | POA: Diagnosis not present

## 2019-04-30 DIAGNOSIS — G4710 Hypersomnia, unspecified: Secondary | ICD-10-CM | POA: Diagnosis not present

## 2019-04-30 DIAGNOSIS — G4733 Obstructive sleep apnea (adult) (pediatric): Secondary | ICD-10-CM | POA: Diagnosis not present
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
## 2019-05-30 DIAGNOSIS — G4710 Hypersomnia, unspecified: Secondary | ICD-10-CM | POA: Diagnosis not present

## 2019-05-30 DIAGNOSIS — G4733 Obstructive sleep apnea (adult) (pediatric): Secondary | ICD-10-CM | POA: Diagnosis not present

## 2019-06-10 DIAGNOSIS — Z79899 Other long term (current) drug therapy: Secondary | ICD-10-CM | POA: Diagnosis not present

## 2019-06-10 DIAGNOSIS — E538 Deficiency of other specified B group vitamins: Secondary | ICD-10-CM | POA: Diagnosis not present

## 2019-06-10 DIAGNOSIS — Z0000 Encounter for general adult medical examination without abnormal findings: Secondary | ICD-10-CM | POA: Diagnosis not present

## 2019-06-10 DIAGNOSIS — R7303 Prediabetes: Secondary | ICD-10-CM | POA: Diagnosis not present

## 2019-06-12 DIAGNOSIS — E538 Deficiency of other specified B group vitamins: Secondary | ICD-10-CM | POA: Diagnosis not present

## 2019-06-30 DIAGNOSIS — G4733 Obstructive sleep apnea (adult) (pediatric): Secondary | ICD-10-CM | POA: Diagnosis not present

## 2019-06-30 DIAGNOSIS — R569 Unspecified convulsions: Secondary | ICD-10-CM | POA: Diagnosis not present

## 2019-06-30 DIAGNOSIS — G4710 Hypersomnia, unspecified: Secondary | ICD-10-CM | POA: Diagnosis not present

## 2019-06-30 DIAGNOSIS — Z9989 Dependence on other enabling machines and devices: Secondary | ICD-10-CM | POA: Diagnosis not present

## 2019-11-23 ENCOUNTER — Ambulatory Visit: Payer: Federal, State, Local not specified - PPO | Admitting: Podiatry

## 2020-04-12 ENCOUNTER — Ambulatory Visit (INDEPENDENT_AMBULATORY_CARE_PROVIDER_SITE_OTHER): Payer: Federal, State, Local not specified - PPO | Admitting: Family Medicine

## 2020-04-12 ENCOUNTER — Other Ambulatory Visit: Payer: Self-pay

## 2020-04-12 ENCOUNTER — Encounter (INDEPENDENT_AMBULATORY_CARE_PROVIDER_SITE_OTHER): Payer: Self-pay | Admitting: Family Medicine

## 2020-04-12 VITALS — BP 107/70 | HR 66 | Temp 98.0°F | Ht 67.0 in | Wt 377.0 lb

## 2020-04-12 DIAGNOSIS — R0602 Shortness of breath: Secondary | ICD-10-CM | POA: Diagnosis not present

## 2020-04-12 DIAGNOSIS — Z6841 Body Mass Index (BMI) 40.0 and over, adult: Secondary | ICD-10-CM

## 2020-04-12 DIAGNOSIS — R739 Hyperglycemia, unspecified: Secondary | ICD-10-CM

## 2020-04-12 DIAGNOSIS — Z0289 Encounter for other administrative examinations: Secondary | ICD-10-CM

## 2020-04-12 DIAGNOSIS — F3289 Other specified depressive episodes: Secondary | ICD-10-CM

## 2020-04-12 DIAGNOSIS — R5383 Other fatigue: Secondary | ICD-10-CM

## 2020-04-12 DIAGNOSIS — E538 Deficiency of other specified B group vitamins: Secondary | ICD-10-CM

## 2020-04-12 DIAGNOSIS — Z9189 Other specified personal risk factors, not elsewhere classified: Secondary | ICD-10-CM | POA: Diagnosis not present

## 2020-04-12 DIAGNOSIS — E559 Vitamin D deficiency, unspecified: Secondary | ICD-10-CM

## 2020-04-12 DIAGNOSIS — D649 Anemia, unspecified: Secondary | ICD-10-CM

## 2020-04-12 NOTE — Progress Notes (Signed)
Chief Complaint:   OBESITY Tracy Mcpherson Mcpherson (MR# [id number]) is a 49 y.o. female who presents for evaluation and treatment of obesity and related comorbidities. Current BMI is Body mass index is 59.05 kg/m. Tracy Mcpherson Mcpherson has been struggling with her weight for many years and has been unsuccessful in either losing weight, maintaining weight loss, or reaching her healthy weight goal.  Tracy Mcpherson Mcpherson has considered weight loss surgery in the past and she is still considering this option.  Tracy Mcpherson Mcpherson is currently in the action stage of change and ready to dedicate time achieving and maintaining a healthier weight. Tracy Mcpherson Mcpherson is interested in becoming our patient and working on intensive lifestyle modifications including (but not limited to) diet and exercise for weight loss.  Tracy Mcpherson Mcpherson's habits were reviewed today and are as follows: Her family eats meals together, she thinks her family will eat healthier with her, her desired weight loss is 207 lbs, she has been heavy most of her life, she started gaining weight in 2007, her heaviest weight ever was 389 pounds, she has significant food cravings issues, she snacks frequently in the evenings, she skips meals frequently, she frequently makes poor food choices, she has problems with excessive hunger, she frequently eats larger portions than normal, she has binge eating behaviors and she struggles with emotional eating.  Depression Screen Tracy Mcpherson Mcpherson's Food and Mood (modified PHQ-9) score was 18.  Depression screen PHQ 2/9 04/12/2020  Decreased Interest 3  Down, Depressed, Hopeless 3  PHQ - 2 Score 6  Altered sleeping 2  Tired, decreased energy 3  Change in appetite 3  Feeling bad or failure about yourself  0  Trouble concentrating 1  Moving slowly or fidgety/restless 3  Suicidal thoughts 0  PHQ-9 Score 18  Difficult doing work/chores Extremely dIfficult   Subjective:   1. Other fatigue Tracy Mcpherson Mcpherson admits to daytime somnolence and admits to waking up still tired. Patent has a  history of symptoms of daytime fatigue and morning headache. Tracy Mcpherson Mcpherson generally gets 7 hours of sleep per night, and states that she has nightime awakenings. Snoring is not present. Apneic episodes are not present. Epworth Sleepiness Score is 10.  2. Short of breath on exertion Tracy Mcpherson Mcpherson notes increasing shortness of breath with exercising and seems to be worsening over time with weight gain. She notes getting out of breath sooner with activity than she used to. This has not gotten worse recently. Tracy Mcpherson Mcpherson denies shortness of breath at rest or orthopnea.  3. Hyperglycemia Tracy Mcpherson Mcpherson notes polyphagia and has been told she has pre-diabetes in the past.  4. Vitamin D deficiency Tracy Mcpherson Mcpherson is on Vit D prescription, and she has no recent labs in [facility].  5. Vitamin B 12 deficiency Tracy Mcpherson Mcpherson is on B12 injections 1 time per month. She has no recent labs in [place].  6. Anemia, unspecified type Tracy Mcpherson Mcpherson is not on iron supplementation, and she has no recent labs in Epic.  7. Other depression with emotional eating Tracy Mcpherson Mcpherson notes significant emotional eating and guilt about food and weight.  8. At risk for diabetes mellitus Tracy Mcpherson Mcpherson is at higher than average risk for developing diabetes due to her obesity.   Assessment/Plan:   1. Other fatigue Tracy Mcpherson Mcpherson does feel that her weight is causing her energy to be lower than it should be. Fatigue may be related to obesity, depression or many other causes. Labs will be ordered, and in the meanwhile, Tracy Mcpherson Mcpherson will focus on self care including making healthy food choices, increasing physical activity and focusing on stress reduction.  -  EKG 12-Lead - Comprehensive metabolic panel - CBC with Differential/Platelet - Hemoglobin A1c - Insulin, random - Lipid Panel With LDL/HDL Ratio - VITAMIN D 25 Hydroxy (Vit-D Deficiency, Fractures) - Vitamin B12 - Folate - T3 - T4, free - TSH  2. Short of breath on exertion Tracy Mcpherson Mcpherson does feel that she gets out of breath more easily that she used to when she  exercises. Tracy Mcpherson Mcpherson's shortness of breath appears to be obesity related and exercise induced. She has agreed to work on weight loss and gradually increase exercise to treat her exercise induced shortness of breath. Will continue to monitor closely.  3. Hyperglycemia Fasting labs will be obtained today and results with be discussed with Tracy Mcpherson Mcpherson in 2 weeks at her follow up visit. In the meanwhile Tracy Mcpherson Mcpherson was started on a lower simple carbohydrate diet and will work on weight loss efforts.  - Comprehensive metabolic panel - CBC with Differential/Platelet - Hemoglobin A1c - Insulin, random - Lipid Panel With LDL/HDL Ratio  4. Vitamin D deficiency Low Vitamin D level contributes to fatigue and are associated with obesity, breast, and colon cancer. We will check labs today. Tracy Mcpherson Mcpherson will follow-up for routine testing of Vitamin D, at least 2-3 times per year to avoid over-replacement.  - VITAMIN D 25 Hydroxy (Vit-D Deficiency, Fractures)  5. Vitamin B 12 deficiency The diagnosis was reviewed with the patient. We will continue to monitor. We will check labs today. Orders and follow up as documented in patient record.  - Comprehensive metabolic panel - CBC with Differential/Platelet - Vitamin B12 - Folate  6. Anemia, unspecified type We will check labs today. Orders and follow up as documented in patient record.  - Comprehensive metabolic panel - CBC with Differential/Platelet - Vitamin B12 - Folate  7. Other depression with emotional eating Behavior modification techniques were discussed today to help Tracy Mcpherson Mcpherson deal with her emotional/non-hunger eating behaviors. We will refer to Dr. Mallie Mussel, our Bariatric Psychologist for evaluation. Orders and follow up as documented in patient record.   8. At risk for diabetes mellitus Tracy Mcpherson Mcpherson was given approximately 30 minutes of diabetes education and counseling today. We discussed intensive lifestyle modifications today with an emphasis on weight loss as well as  increasing exercise and decreasing simple carbohydrates in her diet. We also reviewed medication options with an emphasis on risk versus benefit of those discussed.   Repetitive spaced learning was employed today to elicit superior memory formation and behavioral change.  9. Class 3 severe obesity with serious comorbidity and body mass index (BMI) of 50.0 to 59.9 in adult, unspecified obesity type (Tracy Mcpherson Mcpherson) Tracy Mcpherson Mcpherson is currently in the action stage of change and her goal is to continue with weight loss efforts. I recommend Tracy Mcpherson Mcpherson begin the structured treatment plan as follows:  She has agreed to the Category 3 Plan.  Exercise goals: As is.   Behavioral modification strategies: meal planning and cooking strategies.  She was informed of the importance of frequent follow-up visits to maximize her success with intensive lifestyle modifications for her multiple health conditions. She was informed we would discuss her lab results at her next visit unless there is a critical issue that needs to be addressed sooner. Tracy Mcpherson Mcpherson agreed to keep her next visit at the agreed upon time to discuss these results.  Objective:   Blood pressure 107/70, pulse 66, temperature 98 F (36.7 C), temperature source Oral, height 5\' 7"  (1.702 m), weight (!) 377 lb (171 kg), last menstrual period 10/13/2019, SpO2 97 %. Body mass index is  59.05 kg/m.  EKG: Normal sinus rhythm, rate 68 BPM.  Indirect Calorimeter completed today shows a VO2 of 217 and a REE of 1509.  Her calculated basal metabolic rate is 7014 thus her basal metabolic rate is worse than expected.  General: Cooperative, alert, well developed, in no acute distress. HEENT: Conjunctivae and lids unremarkable. Cardiovascular: Regular rhythm.  Lungs: Normal work of breathing. Neurologic: No focal deficits.   Lab Results  Component Value Date   CREATININE 1.00 03/29/2012   BUN 15 03/29/2012   NA 141 03/29/2012   K 3.9 03/29/2012   CL 105 03/29/2012   CO2 24  02/20/2012   Lab Results  Component Value Date   ALT 8 02/20/2012   AST 13 02/20/2012   ALKPHOS 106 02/20/2012   BILITOT 0.1 (L) 02/20/2012   No results found for: HGBA1C No results found for: INSULIN No results found for: TSH No results found for: CHOL, HDL, LDLCALC, LDLDIRECT, TRIG, CHOLHDL Lab Results  Component Value Date   WBC 12.1 (H) 02/20/2012   HGB 13.3 03/29/2012   HCT 39.0 03/29/2012   MCV 73.1 (L) 02/20/2012   PLT 383 02/20/2012   Lab Results  Component Value Date   IRON 13 (L) 02/20/2012   TIBC 281 02/20/2012   FERRITIN 55 02/20/2012   Attestation Statements:   Reviewed by clinician on day of visit: allergies, medications, problem list, medical history, surgical history, family history, social history, and previous encounter notes.   I, Trixie Dredge, am acting as transcriptionist for Dennard Nip, MD.  I have reviewed the above documentation for accuracy and completeness, and I agree with the above. - Dennard Nip, MD

## 2020-04-13 LAB — HEMOGLOBIN A1C
Est. average glucose Bld gHb Est-mCnc: 128 mg/dL
Hgb A1c MFr Bld: 6.1 % — ABNORMAL HIGH (ref 4.8–5.6)

## 2020-04-13 LAB — COMPREHENSIVE METABOLIC PANEL
ALT: 11 IU/L (ref 0–32)
AST: 19 IU/L (ref 0–40)
Albumin/Globulin Ratio: 1.3 (ref 1.2–2.2)
Albumin: 4.3 g/dL (ref 3.8–4.8)
Alkaline Phosphatase: 104 IU/L (ref 48–121)
BUN/Creatinine Ratio: 11 (ref 9–23)
BUN: 12 mg/dL (ref 6–24)
Bilirubin Total: 0.3 mg/dL (ref 0.0–1.2)
CO2: 22 mmol/L (ref 20–29)
Calcium: 9.5 mg/dL (ref 8.7–10.2)
Chloride: 102 mmol/L (ref 96–106)
Creatinine, Ser: 1.1 mg/dL — ABNORMAL HIGH (ref 0.57–1.00)
GFR calc Af Amer: 69 mL/min/{1.73_m2} (ref >59)
GFR calc non Af Amer: 60 mL/min/{1.73_m2} (ref >59)
Globulin, Total: 3.3 g/dL (ref 1.5–4.5)
Glucose: 103 mg/dL — ABNORMAL HIGH (ref 65–99)
Potassium: 4.4 mmol/L (ref 3.5–5.2)
Sodium: 141 mmol/L (ref 134–144)
Total Protein: 7.6 g/dL (ref 6.0–8.5)

## 2020-04-13 LAB — LIPID PANEL WITH LDL/HDL RATIO
Cholesterol, Total: 201 mg/dL — ABNORMAL HIGH (ref 100–199)
HDL: 66 mg/dL (ref >39)
LDL Chol Calc (NIH): 122 mg/dL — ABNORMAL HIGH (ref 0–99)
LDL/HDL Ratio: 1.8 ratio (ref 0.0–3.2)
Triglycerides: 72 mg/dL (ref 0–149)
VLDL Cholesterol Cal: 13 mg/dL (ref 5–40)

## 2020-04-13 LAB — INSULIN, RANDOM: INSULIN: 6.3 u[IU]/mL (ref 2.6–24.9)

## 2020-04-13 LAB — CBC WITH DIFFERENTIAL/PLATELET
Basophils Absolute: 0.1 10*3/uL (ref 0.0–0.2)
Basos: 1 %
EOS (ABSOLUTE): 0.2 10*3/uL (ref 0.0–0.4)
Eos: 2 %
Hematocrit: 35.3 % (ref 34.0–46.6)
Hemoglobin: 11.1 g/dL (ref 11.1–15.9)
Immature Grans (Abs): 0 10*3/uL (ref 0.0–0.1)
Immature Granulocytes: 0 %
Lymphocytes Absolute: 3.1 10*3/uL (ref 0.7–3.1)
Lymphs: 30 %
MCH: 23.2 pg — ABNORMAL LOW (ref 26.6–33.0)
MCHC: 31.4 g/dL — ABNORMAL LOW (ref 31.5–35.7)
MCV: 74 fL — ABNORMAL LOW (ref 79–97)
Monocytes Absolute: 0.5 10*3/uL (ref 0.1–0.9)
Monocytes: 5 %
Neutrophils Absolute: 6.5 10*3/uL (ref 1.4–7.0)
Neutrophils: 62 %
Platelets: 386 10*3/uL (ref 150–450)
RBC: 4.79 x10E6/uL (ref 3.77–5.28)
RDW: 16 % — ABNORMAL HIGH (ref 11.7–15.4)
WBC: 10.3 10*3/uL (ref 3.4–10.8)

## 2020-04-13 LAB — VITAMIN D 25 HYDROXY (VIT D DEFICIENCY, FRACTURES): Vit D, 25-Hydroxy: 42.7 ng/mL (ref 30.0–100.0)

## 2020-04-13 LAB — T4, FREE: Free T4: 1.29 ng/dL (ref 0.82–1.77)

## 2020-04-13 LAB — FOLATE: Folate: 19.3 ng/mL (ref >3.0)

## 2020-04-13 LAB — TSH: TSH: 1.13 u[IU]/mL (ref 0.450–4.500)

## 2020-04-13 LAB — VITAMIN B12: Vitamin B-12: 848 pg/mL (ref 232–1245)

## 2020-04-13 LAB — T3: T3, Total: 115 ng/dL (ref 71–180)
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
## 2020-04-20 ENCOUNTER — Telehealth (INDEPENDENT_AMBULATORY_CARE_PROVIDER_SITE_OTHER): Payer: Federal, State, Local not specified - PPO | Admitting: Psychology

## 2020-04-20 ENCOUNTER — Other Ambulatory Visit: Payer: Self-pay

## 2020-04-20 DIAGNOSIS — F4323 Adjustment disorder with mixed anxiety and depressed mood: Secondary | ICD-10-CM | POA: Diagnosis not present

## 2020-04-20 NOTE — Progress Notes (Unsigned)
Office: [phone]  /  Fax: [phone]    Date: May 04, 2020   Appointment Start Time: *** Duration: *** minutes Provider: Glennie Isle, Psy.D. Type of Session: Individual Therapy  Location of Patient: {gbptloc:23249} Location of Provider: {Location of Service:22491} Type of Contact: Telepsychological Visit via MyChart Video Visit  Session Content:This provider called Tracy Mcpherson at 10:02am as she did not present for the telepsychological appointment. *** As such, today's appointment was initiated *** minutes late. Tracy Mcpherson is a 49 y.o. female presenting via [place] Visit for a follow-up appointment to address the previously established treatment goal of increasing coping skills. Today's appointment was a telepsychological visit due to COVID-19. Tracy Mcpherson provided verbal consent for today's telepsychological appointment and she is aware she is responsible for securing confidentiality on her end of the session. Prior to proceeding with today's appointment, Tracy Mcpherson's physical location at the time of this appointment was obtained as well a phone number she could be reached at in the event of technical difficulties. Tracy Mcpherson and this provider participated in today's telepsychological service.   This provider conducted a brief check-in and verbally administered the PHQ-9 and GAD-7. *** Tracy Mcpherson was receptive to today's appointment as evidenced by openness to sharing, responsiveness to feedback, and {gbreceptiveness:23401}.  Mental Status Examination:  Appearance: {Appearance:22431} Behavior: {Behavior:22445} Mood: {gbmood:21757} Affect: {Affect:22436} Speech: {Speech:22432} Eye Contact: {Eye Contact:22433} Psychomotor Activity: {Motor Activity:22434} Gait: {gbgait:23404} Thought Process: {thought process:22448}  Thought Content/Perception: {disturbances:22451} Orientation: {Orientation:22437} Memory/Concentration: {gbcognition:22449} Insight/Judgment: {Insight:22446}  Structured Assessments  Results: The Patient Health Questionnaire-9 (PHQ-9) is a self-report measure that assesses symptoms and severity of depression over the course of the last two weeks. Tracy Mcpherson obtained a score of *** suggesting {GBPHQ9SEVERITY:21752}. Tracy Mcpherson finds the endorsed symptoms to be {gbphq9difficulty:21754}. [0= Not at all; 1= Several days; 2= More than half the days; 3= Nearly every day] Little interest or pleasure in doing things ***  Feeling down, depressed, or hopeless ***  Trouble falling or staying asleep, or sleeping too much ***  Feeling tired or having little energy ***  Poor appetite or overeating ***  Feeling bad about yourself --- or that you are a failure or have let yourself or your family down ***  Trouble concentrating on things, such as reading the newspaper or watching television ***  Moving or speaking so slowly that other people could have noticed? Or the opposite --- being so fidgety or restless that you have been moving around a lot more than usual ***  Thoughts that you would be better off dead or hurting yourself in some way ***  PHQ-9 Score ***    The Generalized Anxiety Disorder-7 (GAD-7) is a brief self-report measure that assesses symptoms of anxiety over the course of the last two weeks. Tracy Mcpherson obtained a score of *** suggesting {gbgad7severity:21753}. Tracy Mcpherson finds the endorsed symptoms to be {gbphq9difficulty:21754}. 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 Tracy Mcpherson has demonstrated progress in her goal as evidenced by {gbtxprogress:22839}. Tracy Mcpherson also {gbtxprogress2:22951}.  Plan: The next appointment will be scheduled in {gbweeks:21758}, which will be {gbtxmodality:23402}. The next session will focus on {Plan for Next Appointment:23400}.

## 2020-04-26 ENCOUNTER — Other Ambulatory Visit: Payer: Self-pay

## 2020-04-26 ENCOUNTER — Encounter (INDEPENDENT_AMBULATORY_CARE_PROVIDER_SITE_OTHER): Payer: Self-pay | Admitting: Family Medicine

## 2020-04-26 ENCOUNTER — Ambulatory Visit (INDEPENDENT_AMBULATORY_CARE_PROVIDER_SITE_OTHER): Payer: Federal, State, Local not specified - PPO | Admitting: Family Medicine

## 2020-04-26 VITALS — BP 125/80 | HR 70 | Temp 97.8°F | Ht 67.0 in | Wt 378.0 lb

## 2020-04-26 DIAGNOSIS — Z6841 Body Mass Index (BMI) 40.0 and over, adult: Secondary | ICD-10-CM

## 2020-04-26 DIAGNOSIS — R7303 Prediabetes: Secondary | ICD-10-CM | POA: Diagnosis not present

## 2020-04-26 DIAGNOSIS — E559 Vitamin D deficiency, unspecified: Secondary | ICD-10-CM | POA: Diagnosis not present

## 2020-04-26 DIAGNOSIS — Z9189 Other specified personal risk factors, not elsewhere classified: Secondary | ICD-10-CM | POA: Diagnosis not present

## 2020-04-26 MED ORDER — VITAMIN D (ERGOCALCIFEROL) 1.25 MG (50000 UNIT) PO CAPS: 50000.0000 [IU] | ORAL_CAPSULE | ORAL | 0 refills | Status: AC

## 2020-04-26 MED ORDER — METFORMIN HCL 500 MG PO TABS: 500.0000 mg | ORAL_TABLET | Freq: Every day | ORAL | 0 refills | Status: DC

## 2020-04-27 NOTE — Progress Notes (Signed)
Chief Complaint:   OBESITY Tracy Mcpherson is here to discuss her progress with her obesity treatment plan along with follow-up of her obesity related diagnoses. Tracy Mcpherson is on the Category 3 Plan and states she is following her eating plan approximately 0% of the time. Tracy Mcpherson states she is walking for 40 minutes 3 times per week.  Today's visit was #: 2 Starting weight: 377 lbs Starting date: 04/12/2020 Today's weight: 378 lbs Today's date: 04/26/2020 Total lbs lost to date: 0 Total lbs lost since last in-office visit: 0  Interim History: Tracy Mcpherson struggled to follow her plan as a structured plan, and it doesn't seem to work for her. She went back to Weight Wat[employer] instead but she hasnt been able to lose weight. Her RMR is much lower than expected.  Subjective:   1. Vitamin D deficiency Tracy Mcpherson has been on Vit D prescription for 1 year, but her Vit D level is still not at goal.  2. Pre-diabetes Tracy Mcpherson's A1c and glucose are elevated. She denies polyphagia. She has tried to decrease carbohydrates in her diet overall.  3. At risk for diabetes mellitus Tracy Mcpherson is at higher than average risk for developing diabetes due to her obesity.   Assessment/Plan:   1. Vitamin D deficiency Low Vitamin D level contributes to fatigue and are associated with obesity, breast, and colon cancer. Tracy Mcpherson agreed to increase prescription Vitamin D to 50,000 IU every 3 days, with no refills. She will follow-up for routine testing of Vitamin D, at least 2-3 times per year to avoid over-replacement.  - Vitamin D, Ergocalciferol, (DRISDOL) 1.25 MG (50000 UNIT) CAPS capsule; Take 1 capsule (50,000 Units total) by mouth every 3 (three) days.  Dispense: 10 capsule; Refill: 0  2. Pre-diabetes Tracy Mcpherson will continue to work on weight loss, exercise, and decreasing simple carbohydrates to help decrease the risk of diabetes. Tracy Mcpherson agreed to start metformin 500 mg q AM with no refills.  - metFORMIN (GLUCOPHAGE) 500 MG tablet; Take 1  tablet (500 mg total) by mouth daily with breakfast.  Dispense: 30 tablet; Refill: 0  3. At risk for diabetes mellitus Tracy Mcpherson was given approximately 30 minutes of diabetes education and counseling today. We discussed intensive lifestyle modifications today with an emphasis on weight loss as well as increasing exercise and decreasing simple carbohydrates in her diet. We also reviewed medication options with an emphasis on risk versus benefit of those discussed.   Repetitive spaced learning was employed today to elicit superior memory formation and behavioral change.  4. Class 3 severe obesity with serious comorbidity and body mass index (BMI) of 50.0 to 59.9 in adult, unspecified obesity type ([place]) Tracy Mcpherson is currently in the action stage of change. As such, her goal is to continue with weight loss efforts. She has agreed to keeping a food journal and adhering to recommended goals of 1200-1500 calories and 85+ grams of protein daily.   Exercise goals: As is.  Behavioral modification strategies: increasing lean protein intake and meal planning and cooking strategies.  Tracy Mcpherson has agreed to follow-up with our clinic in 2 weeks. She was informed of the importance of frequent follow-up visits to maximize her success with intensive lifestyle modifications for her multiple health conditions.   Objective:   Blood pressure 125/80, pulse 70, temperature 97.8 F (36.6 C), temperature source Oral, height 5\' 7"  (1.702 m), weight (!) 378 lb (171.5 kg), SpO2 96 %. Body mass index is 59.2 kg/m.  General: Cooperative, alert, well developed, in no acute distress.  HEENT: Conjunctivae and lids unremarkable. Cardiovascular: Regular rhythm.  Lungs: Normal work of breathing. Neurologic: No focal deficits.   Lab Results  Component Value Date   CREATININE 1.10 (H) 04/12/2020   BUN 12 04/12/2020   NA 141 04/12/2020   K 4.4 04/12/2020   CL 102 04/12/2020   CO2 22 04/12/2020   Lab Results  Component Value  Date   ALT 11 04/12/2020   AST 19 04/12/2020   ALKPHOS 104 04/12/2020   BILITOT 0.3 04/12/2020   Lab Results  Component Value Date   HGBA1C 6.1 (H) 04/12/2020   Lab Results  Component Value Date   INSULIN 6.3 04/12/2020   Lab Results  Component Value Date   TSH 1.130 04/12/2020   Lab Results  Component Value Date   CHOL 201 (H) 04/12/2020   HDL 66 04/12/2020   LDLCALC 122 (H) 04/12/2020   TRIG 72 04/12/2020   Lab Results  Component Value Date   WBC 10.3 04/12/2020   HGB 11.1 04/12/2020   HCT 35.3 04/12/2020   MCV 74 (L) 04/12/2020   PLT 386 04/12/2020   Lab Results  Component Value Date   IRON 13 (L) 02/20/2012   TIBC 281 02/20/2012   FERRITIN 55 02/20/2012   Attestation Statements:   Reviewed by clinician on day of visit: allergies, medications, problem list, medical history, surgical history, family history, social history, and previous encounter notes.   I, Trixie Dredge, am acting as transcriptionist for Dennard Nip, MD.  I have reviewed the above documentation for accuracy and completeness, and I agree with the above. -  Dennard Nip, MD

## 2020-05-04 ENCOUNTER — Telehealth (INDEPENDENT_AMBULATORY_CARE_PROVIDER_SITE_OTHER): Payer: Self-pay | Admitting: Psychology

## 2020-05-04 ENCOUNTER — Telehealth (INDEPENDENT_AMBULATORY_CARE_PROVIDER_SITE_OTHER): Payer: Federal, State, Local not specified - PPO | Admitting: Psychology

## 2020-05-04 ENCOUNTER — Other Ambulatory Visit: Payer: Self-pay

## 2020-05-04 NOTE — Telephone Encounter (Signed)
°  Office: [phone]  /  Fax: [phone]  Date of Call: May 04, 2020  Time of Call: 10:02am Duration of Call: ~ 2 minutes Provider: Glennie Isle, PsyD  CONTENT: This provider called Dominga to check-in as she did not present for today's MyChart Video Visit appointment at 10:00am. Fayth shared she has a sinus infection and asked to reschedule. She acknowledged understanding that the one time no show fee waiver would be applied. Janiyla denied any other concerns/problems. No evidence of suicidal and homicidal ideation, plan, or intent.  PLAN: Clementine is scheduled for an appointment on May 19, 2020 at 10:00am via [facility] Visit.

## 2020-05-05 NOTE — Progress Notes (Unsigned)
Office: [phone]  /  Fax: [phone]    Date: May 19, 2020   Appointment Start Time: *** Duration: *** minutes Provider: Glennie Isle, Psy.D. Type of Session: Individual Therapy  Location of Patient: {gbptloc:23249} Location of Provider: {Location of Service:22491} Type of Contact: Telepsychological Visit via MyChart Video Visit  Session Content: Tracy Mcpherson is a 49 y.o. female presenting via [place] Visit for a follow-up appointment to address the previously established treatment goal of increasing coping skills. Today's appointment was a telepsychological visit due to COVID-19. Tracy Mcpherson provided verbal consent for today's telepsychological appointment and she is aware she is responsible for securing confidentiality on her end of the session. Prior to proceeding with today's appointment, Tracy Mcpherson's physical location at the time of this appointment was obtained as well a phone number she could be reached at in the event of technical difficulties. Tracy Mcpherson and this provider participated in today's telepsychological service.   This provider conducted a brief check-in and verbally administered the PHQ-9 and GAD-7. *** Tracy Mcpherson was receptive to today's appointment as evidenced by openness to sharing, responsiveness to feedback, and {gbreceptiveness:23401}.  Mental Status Examination:  Appearance: {Appearance:22431} Behavior: {Behavior:22445} Mood: {gbmood:21757} Affect: {Affect:22436} Speech: {Speech:22432} Eye Contact: {Eye Contact:22433} Psychomotor Activity: {Motor Activity:22434} Gait: {gbgait:23404} Thought Process: {thought process:22448}  Thought Content/Perception: {disturbances:22451} Orientation: {Orientation:22437} Memory/Concentration: {gbcognition:22449} Insight/Judgment: {Insight:22446}  Structured Assessments Results: The Patient Health Questionnaire-9 (PHQ-9) is a self-report measure that assesses symptoms and severity of depression over the course of the last two weeks.  Tracy Mcpherson obtained a score of *** suggesting {GBPHQ9SEVERITY:21752}. Tracy Mcpherson finds the endorsed symptoms to be {gbphq9difficulty:21754}. [0= Not at all; 1= Several days; 2= More than half the days; 3= Nearly every day] Little interest or pleasure in doing things ***  Feeling down, depressed, or hopeless ***  Trouble falling or staying asleep, or sleeping too much ***  Feeling tired or having little energy ***  Poor appetite or overeating ***  Feeling bad about yourself --- or that you are a failure or have let yourself or your family down ***  Trouble concentrating on things, such as reading the newspaper or watching television ***  Moving or speaking so slowly that other people could have noticed? Or the opposite --- being so fidgety or restless that you have been moving around a lot more than usual ***  Thoughts that you would be better off dead or hurting yourself in some way ***  PHQ-9 Score ***    The Generalized Anxiety Disorder-7 (GAD-7) is a brief self-report measure that assesses symptoms of anxiety over the course of the last two weeks. Tracy Mcpherson obtained a score of *** suggesting {gbgad7severity:21753}. Tracy Mcpherson finds the endorsed symptoms to be {gbphq9difficulty:21754}. 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 Tracy Mcpherson has demonstrated progress  in her goal as evidenced by {gbtxprogress:22839}. Tracy Mcpherson also {gbtxprogress2:22951}.  Plan: The next appointment will be  scheduled in {gbweeks:21758}, which will be {gbtxmodality:23402}. The next session will focus on {Plan for Next Appointment:23400}.

## 2020-05-12 ENCOUNTER — Ambulatory Visit (INDEPENDENT_AMBULATORY_CARE_PROVIDER_SITE_OTHER): Payer: Federal, State, Local not specified - PPO | Admitting: Adult Health

## 2020-05-12 ENCOUNTER — Other Ambulatory Visit: Payer: Self-pay

## 2020-05-12 ENCOUNTER — Encounter (INDEPENDENT_AMBULATORY_CARE_PROVIDER_SITE_OTHER): Payer: Self-pay | Admitting: Adult Health

## 2020-05-12 VITALS — BP 138/85 | HR 61 | Temp 97.9°F | Ht 67.0 in | Wt 382.0 lb

## 2020-05-12 DIAGNOSIS — Z6841 Body Mass Index (BMI) 40.0 and over, adult: Secondary | ICD-10-CM

## 2020-05-12 DIAGNOSIS — R7303 Prediabetes: Secondary | ICD-10-CM | POA: Diagnosis not present

## 2020-05-12 DIAGNOSIS — E559 Vitamin D deficiency, unspecified: Secondary | ICD-10-CM | POA: Diagnosis not present

## 2020-05-12 NOTE — Progress Notes (Signed)
Chief Complaint:   OBESITY Tracy Mcpherson is here to discuss her progress with her obesity treatment plan along with follow-up of her obesity related diagnoses. Tracy Mcpherson is on the Category 3 Plan and states she is following her eating plan approximately 0% of the time. Tracy Mcpherson states she walked last week (no regular exercise).   Today's visit was #: 3 Starting weight: 377 lbs Starting date: 04/12/2020 Today's weight: 382 lbs Today's date: 05/12/2020 Total lbs lost to date: 0 Total lbs lost since last in-office visit: 0  Interim History: Tracy Mcpherson [place] the beach with [place] 1 week and admits to "not even trying to stay on the plan." When home, she has been following the Category 3 meal options and has yet to use the scale to weigh protein at lunch/dinner. She has a meeting/in-service with [facility] this evening for possible bariatric surgery. She has continued with Weight Watchers for the emotional support - great!  Subjective:   Vitamin D deficiency. Tracy Mcpherson is on prescription strength Vitamin D supplementation and is tolerating it well. No nausea, vomiting, or muscle weakness.    Ref. Range 04/12/2020 10:36  Vitamin D, 25-Hydroxy Latest Ref Range: 30.0 - 100.0 ng/mL 42.7   Prediabetes. Tracy Mcpherson has a diagnosis of prediabetes based on her elevated HgA1c and was informed this puts her at greater risk of developing diabetes. She continues to work on diet and exercise to decrease her risk of diabetes. She denies nausea or hypoglycemia. Tracy Mcpherson is on metformin 500 mg daily and tolerating it well. She denies polyphagia.  Lab Results  Component Value Date   HGBA1C 6.1 (H) 04/12/2020   Lab Results  Component Value Date   INSULIN 6.3 04/12/2020   Assessment/Plan:   Vitamin D deficiency. Low Vitamin D level contributes to fatigue and are associated with obesity, breast, and colon cancer. She agrees to continue to take prescription Vitamin D as directed (no need for medication  refill today) and will follow-up for routine testing of Vitamin D every 3 months.  Prediabetes. Tracy Mcpherson will continue to work on weight loss, exercise, and decreasing simple carbohydrates to help decrease the risk of diabetes. She will continue metformin (no need for refill today) and will have labs checked every 3 months.  Class 3 severe obesity with serious comorbidity and body mass index (BMI) of 50.0 to 59.9 in adult, unspecified obesity type ([place]).  Tracy Mcpherson is currently in the action stage of change. As such, her goal is to continue with weight loss efforts. She has agreed to the Category 3 Plan.   Handout was provided on Additional Breakfast Options.  Exercise goals: Tracy Mcpherson will continue her current exercise regimen.   Behavioral modification strategies: increasing lean protein intake, meal planning and cooking strategies and planning for success.  Tracy Mcpherson has agreed to follow-up with our clinic in 2 weeks. She was informed of the importance of frequent follow-up visits to maximize her success with intensive lifestyle modifications for her multiple health conditions.   Objective:   Blood pressure 138/85, pulse 61, temperature 97.9 F (36.6 C), temperature source Oral, height 5\' 7"  (1.702 m), SpO2 94 %. Body mass index is 59.2 kg/m.  General: Cooperative, alert, well developed, in no acute distress. HEENT: Conjunctivae and lids unremarkable. Cardiovascular: Regular rhythm.  Lungs: Normal work of breathing. Neurologic: No focal deficits.   Lab Results  Component Value Date   CREATININE 1.10 (H) 04/12/2020   BUN 12 04/12/2020   NA 141 04/12/2020  K 4.4 04/12/2020   CL 102 04/12/2020   CO2 22 04/12/2020   Lab Results  Component Value Date   ALT 11 04/12/2020   AST 19 04/12/2020   ALKPHOS 104 04/12/2020   BILITOT 0.3 04/12/2020   Lab Results  Component Value Date   HGBA1C 6.1 (H) 04/12/2020   Lab Results  Component Value Date   INSULIN 6.3 04/12/2020   Lab Results    Component Value Date   TSH 1.130 04/12/2020   Lab Results  Component Value Date   CHOL 201 (H) 04/12/2020   HDL 66 04/12/2020   LDLCALC 122 (H) 04/12/2020   TRIG 72 04/12/2020   Lab Results  Component Value Date   WBC 10.3 04/12/2020   HGB 11.1 04/12/2020   HCT 35.3 04/12/2020   MCV 74 (L) 04/12/2020   PLT 386 04/12/2020   Lab Results  Component Value Date   IRON 13 (L) 02/20/2012   TIBC 281 02/20/2012   FERRITIN 55 02/20/2012   Attestation Statements:   Reviewed by clinician on day of visit: allergies, medications, problem list, medical history, surgical history, family history, social history, and previous encounter notes.  Time spent on visit including pre-visit chart review and post-visit charting and care was 27 minutes.   I, Michaelene Song, am acting as Location manager for [employer], NP-C   I have reviewed the above documentation for accuracy and completeness, and I agree with the above. -  Esaw Grandchild, NP

## 2020-05-17 ENCOUNTER — Other Ambulatory Visit (INDEPENDENT_AMBULATORY_CARE_PROVIDER_SITE_OTHER): Payer: Self-pay | Admitting: Family Medicine

## 2020-05-17 DIAGNOSIS — E559 Vitamin D deficiency, unspecified: Secondary | ICD-10-CM

## 2020-05-17 DIAGNOSIS — R7303 Prediabetes: Secondary | ICD-10-CM

## 2020-05-19 ENCOUNTER — Telehealth (INDEPENDENT_AMBULATORY_CARE_PROVIDER_SITE_OTHER): Payer: Self-pay | Admitting: Psychology

## 2020-05-26 ENCOUNTER — Ambulatory Visit (INDEPENDENT_AMBULATORY_CARE_PROVIDER_SITE_OTHER): Payer: Federal, State, Local not specified - PPO | Admitting: Family Medicine

## 2020-06-09 ENCOUNTER — Ambulatory Visit (INDEPENDENT_AMBULATORY_CARE_PROVIDER_SITE_OTHER): Payer: Federal, State, Local not specified - PPO | Admitting: Adult Health

## 2020-06-16 ENCOUNTER — Ambulatory Visit (INDEPENDENT_AMBULATORY_CARE_PROVIDER_SITE_OTHER): Payer: Federal, State, Local not specified - PPO | Admitting: Adult Health

## 2020-06-21 ENCOUNTER — Ambulatory Visit (INDEPENDENT_AMBULATORY_CARE_PROVIDER_SITE_OTHER): Payer: Federal, State, Local not specified - PPO | Admitting: Family Medicine

## 2020-07-05 ENCOUNTER — Other Ambulatory Visit: Payer: Self-pay | Admitting: Obstetrics and Gynecology

## 2020-07-05 DIAGNOSIS — N912 Amenorrhea, unspecified: Secondary | ICD-10-CM

## 2020-07-05 DIAGNOSIS — D259 Leiomyoma of uterus, unspecified: Secondary | ICD-10-CM

## 2020-07-07 ENCOUNTER — Ambulatory Visit (INDEPENDENT_AMBULATORY_CARE_PROVIDER_SITE_OTHER): Payer: Federal, State, Local not specified - PPO | Admitting: Family Medicine

## 2020-07-21 ENCOUNTER — Other Ambulatory Visit: Payer: Self-pay

## 2020-07-28 ENCOUNTER — Other Ambulatory Visit: Payer: Self-pay

## 2020-08-04 ENCOUNTER — Other Ambulatory Visit: Payer: Self-pay

## 2020-08-17 ENCOUNTER — Other Ambulatory Visit: Payer: Self-pay

## 2020-08-24 ENCOUNTER — Other Ambulatory Visit: Payer: Self-pay

## 2020-08-24 ENCOUNTER — Encounter (INDEPENDENT_AMBULATORY_CARE_PROVIDER_SITE_OTHER): Payer: Self-pay | Admitting: Family Medicine

## 2020-08-24 ENCOUNTER — Ambulatory Visit (INDEPENDENT_AMBULATORY_CARE_PROVIDER_SITE_OTHER): Payer: BC Managed Care – PPO | Admitting: Family Medicine

## 2020-08-24 VITALS — BP 125/74 | HR 72 | Temp 98.1°F | Ht 67.0 in | Wt 376.0 lb

## 2020-08-24 DIAGNOSIS — Z6841 Body Mass Index (BMI) 40.0 and over, adult: Secondary | ICD-10-CM | POA: Diagnosis not present

## 2020-08-24 DIAGNOSIS — F3289 Other specified depressive episodes: Secondary | ICD-10-CM

## 2020-08-24 MED ORDER — TOPIRAMATE 25 MG PO TABS: 25.0000 mg | ORAL_TABLET | Freq: Every day | ORAL | 0 refills | Status: DC

## 2020-08-29 ENCOUNTER — Ambulatory Visit
Admission: RE | Admit: 2020-08-29 | Discharge: 2020-08-29 | Disposition: A | Discharge status: Home / Self Care | Payer: BC Managed Care – PPO | Source: Ambulatory Visit | Attending: Obstetrics and Gynecology | Admitting: Obstetrics and Gynecology

## 2020-08-29 DIAGNOSIS — D259 Leiomyoma of uterus, unspecified: Secondary | ICD-10-CM

## 2020-08-29 DIAGNOSIS — N912 Amenorrhea, unspecified: Secondary | ICD-10-CM

## 2020-08-30 NOTE — Progress Notes (Signed)
Chief Complaint:   OBESITY Tracy Mcpherson is here to discuss her progress with her obesity treatment plan along with follow-up of her obesity related diagnoses. Tracy Mcpherson is on the Category 3 Plan and states she is following her eating plan approximately 0% of the time. Tracy Mcpherson states she is walking for 30 minutes 3 times per week.  Today's visit was #: 4 Starting weight: 377 lbs Starting date: 04/12/2020 Today's weight: 376 lbs Today's date: 08/24/2020 Total lbs lost to date: 1 Total lbs lost since last in-office visit: 6  Interim History: Tracy Mcpherson's last visit was 3 months ago. She has struggled to stay on track but she was still able to lose some weight. She is now able to concentrate on weight loss again.  Subjective:   1. Other depression with emotional eating Tracy Mcpherson notes significant negative emotions related to eating. She was on Topamax at the high dose for seizures but she had significant mental fogginess. She is no longer having periods.  Assessment/Plan:   1. Other depression with emotional eating Behavior modification techniques were discussed today to help Tracy Mcpherson deal with her emotional/non-hunger eating behaviors. Tracy Mcpherson agreed to start Topamax 25 mg qhs with no refills, and she will unlikely notice mental fogginess. Orders and follow up as documented in patient record.   - topiramate (TOPAMAX) 25 MG tablet; Take 1 tablet (25 mg total) by mouth at bedtime.  Dispense: 30 tablet; Refill: 0  2. Class 3 severe obesity with serious comorbidity and body mass index (BMI) of 50.0 to 59.9 in adult, unspecified obesity type (Tracy Mcpherson) Tracy Mcpherson is currently in the action stage of change. As such, her goal is to continue with weight loss efforts. She has agreed to change to the [employer].   Exercise goals: As is.  Behavioral modification strategies: increasing lean protein intake and emotional eating strategies.  Joscelyn has agreed to follow-up with our clinic in 2 weeks. She was informed of the  importance of frequent follow-up visits to maximize her success with intensive lifestyle modifications for her multiple health conditions.   Objective:   Blood pressure 125/74, pulse 72, temperature 98.1 F (36.7 C), height 5\' 7"  (1.702 m), weight (!) 376 lb (170.6 kg), SpO2 98 %. Body mass index is 58.89 kg/m.  General: Cooperative, alert, well developed, in no acute distress. HEENT: Conjunctivae and lids unremarkable. Cardiovascular: Regular rhythm.  Lungs: Normal work of breathing. Neurologic: No focal deficits.   Lab Results  Component Value Date   CREATININE 1.10 (H) 04/12/2020   BUN 12 04/12/2020   NA 141 04/12/2020   K 4.4 04/12/2020   CL 102 04/12/2020   CO2 22 04/12/2020   Lab Results  Component Value Date   ALT 11 04/12/2020   AST 19 04/12/2020   ALKPHOS 104 04/12/2020   BILITOT 0.3 04/12/2020   Lab Results  Component Value Date   HGBA1C 6.1 (H) 04/12/2020   Lab Results  Component Value Date   INSULIN 6.3 04/12/2020   Lab Results  Component Value Date   TSH 1.130 04/12/2020   Lab Results  Component Value Date   CHOL 201 (H) 04/12/2020   HDL 66 04/12/2020   LDLCALC 122 (H) 04/12/2020   TRIG 72 04/12/2020   Lab Results  Component Value Date   WBC 10.3 04/12/2020   HGB 11.1 04/12/2020   HCT 35.3 04/12/2020   MCV 74 (L) 04/12/2020   PLT 386 04/12/2020   Lab Results  Component Value Date   IRON 13 (L)  02/20/2012   TIBC 281 02/20/2012   FERRITIN 55 02/20/2012   Attestation Statements:   Reviewed by clinician on day of visit: allergies, medications, problem list, medical history, surgical history, family history, social history, and previous encounter notes.  Time spent on visit including pre-visit chart review and post-visit care and charting was 30 minutes.    I, Trixie Dredge, am acting as transcriptionist for Dennard Nip, MD.  I have reviewed the above documentation for accuracy and completeness, and I agree with the above. -  Dennard Nip, MD

## 2020-09-06 DIAGNOSIS — G40109 Localization-related (focal) (partial) symptomatic epilepsy and epileptic syndromes with simple partial seizures, not intractable, without status epilepticus: Secondary | ICD-10-CM | POA: Diagnosis not present

## 2020-09-06 DIAGNOSIS — Z6841 Body Mass Index (BMI) 40.0 and over, adult: Secondary | ICD-10-CM | POA: Diagnosis not present

## 2020-09-06 DIAGNOSIS — N3941 Urge incontinence: Secondary | ICD-10-CM | POA: Diagnosis not present

## 2020-09-07 ENCOUNTER — Ambulatory Visit (INDEPENDENT_AMBULATORY_CARE_PROVIDER_SITE_OTHER): Payer: BC Managed Care – PPO | Admitting: Family Medicine

## 2020-09-23 DIAGNOSIS — N951 Menopausal and female climacteric states: Secondary | ICD-10-CM | POA: Diagnosis not present

## 2020-09-23 DIAGNOSIS — Z1211 Encounter for screening for malignant neoplasm of colon: Secondary | ICD-10-CM | POA: Diagnosis not present

## 2020-10-11 DIAGNOSIS — M1711 Unilateral primary osteoarthritis, right knee: Secondary | ICD-10-CM | POA: Diagnosis not present

## 2020-11-23 DIAGNOSIS — S83411A Sprain of medial collateral ligament of right knee, initial encounter: Secondary | ICD-10-CM | POA: Diagnosis not present

## 2020-11-30 DIAGNOSIS — M25561 Pain in right knee: Secondary | ICD-10-CM | POA: Diagnosis not present

## 2020-11-30 DIAGNOSIS — S83411A Sprain of medial collateral ligament of right knee, initial encounter: Secondary | ICD-10-CM | POA: Diagnosis not present

## 2020-11-30 DIAGNOSIS — R262 Difficulty in walking, not elsewhere classified: Secondary | ICD-10-CM | POA: Diagnosis not present

## 2020-12-21 DIAGNOSIS — M25561 Pain in right knee: Secondary | ICD-10-CM | POA: Diagnosis not present

## 2020-12-21 DIAGNOSIS — S83411A Sprain of medial collateral ligament of right knee, initial encounter: Secondary | ICD-10-CM | POA: Diagnosis not present

## 2020-12-21 DIAGNOSIS — R262 Difficulty in walking, not elsewhere classified: Secondary | ICD-10-CM | POA: Diagnosis not present

## 2020-12-21 DIAGNOSIS — S83411D Sprain of medial collateral ligament of right knee, subsequent encounter: Secondary | ICD-10-CM | POA: Diagnosis not present

## 2020-12-26 DIAGNOSIS — G4733 Obstructive sleep apnea (adult) (pediatric): Secondary | ICD-10-CM | POA: Diagnosis not present

## 2020-12-26 DIAGNOSIS — G4710 Hypersomnia, unspecified: Secondary | ICD-10-CM | POA: Diagnosis not present

## 2021-01-04 DIAGNOSIS — J309 Allergic rhinitis, unspecified: Secondary | ICD-10-CM | POA: Diagnosis not present

## 2021-01-04 DIAGNOSIS — H6121 Impacted cerumen, right ear: Secondary | ICD-10-CM | POA: Diagnosis not present

## 2021-01-04 DIAGNOSIS — H9201 Otalgia, right ear: Secondary | ICD-10-CM | POA: Diagnosis not present

## 2021-01-27 DIAGNOSIS — N950 Postmenopausal bleeding: Secondary | ICD-10-CM | POA: Diagnosis not present

## 2021-01-27 DIAGNOSIS — L659 Nonscarring hair loss, unspecified: Secondary | ICD-10-CM | POA: Diagnosis not present

## 2021-01-30 ENCOUNTER — Other Ambulatory Visit: Payer: Self-pay | Admitting: Obstetrics and Gynecology

## 2021-01-30 DIAGNOSIS — N950 Postmenopausal bleeding: Secondary | ICD-10-CM

## 2021-02-14 ENCOUNTER — Ambulatory Visit
Admission: RE | Admit: 2021-02-14 | Discharge: 2021-02-14 | Disposition: A | Discharge status: Home / Self Care | Payer: Federal, State, Local not specified - PPO | Source: Ambulatory Visit | Attending: Obstetrics and Gynecology | Admitting: Obstetrics and Gynecology

## 2021-02-14 DIAGNOSIS — N950 Postmenopausal bleeding: Secondary | ICD-10-CM

## 2021-02-14 DIAGNOSIS — D251 Intramural leiomyoma of uterus: Secondary | ICD-10-CM | POA: Diagnosis not present

## 2021-02-14 DIAGNOSIS — N939 Abnormal uterine and vaginal bleeding, unspecified: Secondary | ICD-10-CM | POA: Diagnosis not present

## 2021-02-15 DIAGNOSIS — M25552 Pain in left hip: Secondary | ICD-10-CM | POA: Diagnosis not present

## 2021-02-15 DIAGNOSIS — M79605 Pain in left leg: Secondary | ICD-10-CM | POA: Diagnosis not present

## 2021-02-15 DIAGNOSIS — M5416 Radiculopathy, lumbar region: Secondary | ICD-10-CM | POA: Diagnosis not present

## 2021-02-16 DIAGNOSIS — M9903 Segmental and somatic dysfunction of lumbar region: Secondary | ICD-10-CM | POA: Diagnosis not present

## 2021-02-16 DIAGNOSIS — M9901 Segmental and somatic dysfunction of cervical region: Secondary | ICD-10-CM | POA: Diagnosis not present

## 2021-02-16 DIAGNOSIS — M9902 Segmental and somatic dysfunction of thoracic region: Secondary | ICD-10-CM | POA: Diagnosis not present

## 2021-02-16 DIAGNOSIS — M9905 Segmental and somatic dysfunction of pelvic region: Secondary | ICD-10-CM | POA: Diagnosis not present

## 2021-02-20 ENCOUNTER — Other Ambulatory Visit: Payer: Self-pay | Admitting: Obstetrics and Gynecology

## 2021-02-21 DIAGNOSIS — M545 Low back pain, unspecified: Secondary | ICD-10-CM | POA: Diagnosis not present

## 2021-03-10 DIAGNOSIS — D219 Benign neoplasm of connective and other soft tissue, unspecified: Secondary | ICD-10-CM | POA: Diagnosis not present

## 2021-03-10 DIAGNOSIS — N939 Abnormal uterine and vaginal bleeding, unspecified: Secondary | ICD-10-CM | POA: Diagnosis not present

## 2021-04-25 DIAGNOSIS — N939 Abnormal uterine and vaginal bleeding, unspecified: Secondary | ICD-10-CM | POA: Diagnosis not present

## 2021-04-25 DIAGNOSIS — N3941 Urge incontinence: Secondary | ICD-10-CM | POA: Diagnosis not present

## 2021-05-25 DIAGNOSIS — G4733 Obstructive sleep apnea (adult) (pediatric): Secondary | ICD-10-CM | POA: Diagnosis not present

## 2021-05-25 DIAGNOSIS — J4540 Moderate persistent asthma, uncomplicated: Secondary | ICD-10-CM | POA: Diagnosis not present

## 2021-05-25 DIAGNOSIS — Z7689 Persons encountering health services in other specified circumstances: Secondary | ICD-10-CM | POA: Diagnosis not present

## 2021-05-25 DIAGNOSIS — G459 Transient cerebral ischemic attack, unspecified: Secondary | ICD-10-CM | POA: Diagnosis not present

## 2021-06-05 DIAGNOSIS — Z20822 Contact with and (suspected) exposure to covid-19: Secondary | ICD-10-CM | POA: Diagnosis not present

## 2021-07-17 DIAGNOSIS — M9903 Segmental and somatic dysfunction of lumbar region: Secondary | ICD-10-CM | POA: Diagnosis not present

## 2021-07-17 DIAGNOSIS — M9902 Segmental and somatic dysfunction of thoracic region: Secondary | ICD-10-CM | POA: Diagnosis not present

## 2021-07-17 DIAGNOSIS — M9901 Segmental and somatic dysfunction of cervical region: Secondary | ICD-10-CM | POA: Diagnosis not present

## 2021-07-17 DIAGNOSIS — M62838 Other muscle spasm: Secondary | ICD-10-CM | POA: Diagnosis not present

## 2021-07-17 DIAGNOSIS — M9905 Segmental and somatic dysfunction of pelvic region: Secondary | ICD-10-CM | POA: Diagnosis not present

## 2021-08-21 ENCOUNTER — Encounter: Payer: Self-pay | Admitting: Hematology and Oncology

## 2021-08-21 ENCOUNTER — Encounter: Payer: Self-pay | Admitting: Family Medicine

## 2021-08-21 ENCOUNTER — Ambulatory Visit: Payer: Federal, State, Local not specified - PPO | Admitting: Family Medicine

## 2021-08-21 ENCOUNTER — Ambulatory Visit: Payer: Self-pay

## 2021-08-21 ENCOUNTER — Ambulatory Visit (INDEPENDENT_AMBULATORY_CARE_PROVIDER_SITE_OTHER): Payer: BC Managed Care – PPO | Admitting: Family Medicine

## 2021-08-21 VITALS — Ht 67.0 in | Wt 300.0 lb

## 2021-08-21 DIAGNOSIS — M222X2 Patellofemoral disorders, left knee: Secondary | ICD-10-CM | POA: Diagnosis not present

## 2021-08-21 DIAGNOSIS — M222X1 Patellofemoral disorders, right knee: Secondary | ICD-10-CM | POA: Insufficient documentation

## 2021-08-21 DIAGNOSIS — M25562 Pain in left knee: Secondary | ICD-10-CM

## 2021-08-21 DIAGNOSIS — M25561 Pain in right knee: Secondary | ICD-10-CM

## 2021-08-21 MED ORDER — DICLOFENAC SODIUM 75 MG PO TBEC: 75.0000 mg | DELAYED_RELEASE_TABLET | Freq: Two times a day (BID) | ORAL | 1 refills | Status: DC

## 2021-08-21 NOTE — Progress Notes (Signed)
  [name] - 50 y.o. female MRN [id number]  Date of birth: [birth year]  SUBJECTIVE:  Including CC & ROS.  No chief complaint on file.   Tracy Mcpherson is a 50 y.o. female that is presenting with acute on chronic bilateral knee pain.  The pain is anterior nature.  No injury or inciting event.  Has tried meloxicam.    Review of Systems See HPI   HISTORY: Past Medical, Surgical, Social, and Family History Reviewed & Updated per EMR.   Pertinent Historical Findings include:  Past Medical History:  Diagnosis Date   Anemia    Anxiety    Asthma    Constipation    Depression    Epilepsy ([place])    History of gestational diabetes    History of herpes simplex type 2 infection    History of pregnancy induced hypertension    Hx of leukocytosis    mild   Iron deficiency anemia    Joint pain    Lower extremity edema    Migraines    Mini stroke    Obesity    Pain in both lower legs    Plantar fasciitis    Prediabetes    Right knee pain    Seasonal allergies    Seizures ([facility])    Sleep apnea    Submucous myoma of uterus    Vitamin B 12 deficiency    Vitamin D deficiency     Past Surgical History:  Procedure Laterality Date   CESAREAN SECTION  11-02-2007   CRANIOTOMY FOR TEMPORAL LOBECTOMY     GYNECOLOGIC CRYOSURGERY     IUD REMOVAL  12-16-2009   REMOVAL THYROID CYST  2000    Family History  Problem Relation Age of Onset   Hypertension Mother    Sleep apnea Mother    Obesity Mother     Social History   Socioeconomic History   Marital status: Legally Separated    Spouse name: Not on file   Number of children: 1   Years of education: Not on file   Highest education level: Not on file  Occupational History   Occupation: Tax adviser  Tobacco Use   Smoking status: Never   Smokeless tobacco: Never  Substance and Sexual Activity   Alcohol use: No   Drug use: No   Sexual activity: Not on file  Other Topics Concern   Not on file  Social History  Narrative   Not on file   Social Determinants of Health   Financial Resource Strain: Not on file  Food Insecurity: Not on file  Transportation Needs: Not on file  Physical Activity: Not on file  Stress: Not on file  Social Connections: Not on file  Intimate Partner Violence: Not on file     PHYSICAL EXAM:  VS: Ht 5\' 7"  (1.702 m)   Wt 300 lb (136.1 kg)   BMI 46.99 kg/m  Physical Exam Gen: NAD, alert, cooperative with exam, well-appearing   Limited ultrasound: Right and left knee:  No effusion appreciated within the suprapatellar pouch. Normal-appearing quadricep patellar tendon. Mild medial joint space narrowing. Normal-appearing lateral joint space  Summary: Mild degenerative changes appreciated  Ultrasound and interpretation by Clearance Coots, MD    ASSESSMENT & PLAN:   Patellofemoral pain syndrome of both knees Symptoms most consistent with patellofemoral.  No effusion appreciated on exam today. -Counseled on home exercise therapy and supportive care. -Diclofenac. -Could consider physical therapy or further imaging.

## 2021-08-21 NOTE — Patient Instructions (Signed)
Nice to meet you Please try ice as needed  Please try the exercises  Please try a recumbent bike   Please send me a message in [facility] with any questions or updates.  Please see me back in 4 weeks.   --Dr. Raeford Razor

## 2021-08-21 NOTE — Assessment & Plan Note (Signed)
Symptoms most consistent with patellofemoral.  No effusion appreciated on exam today. -Counseled on home exercise therapy and supportive care. -Diclofenac. -Could consider physical therapy or further imaging.

## 2021-09-11 DIAGNOSIS — L669 Cicatricial alopecia, unspecified: Secondary | ICD-10-CM | POA: Diagnosis not present

## 2021-09-11 DIAGNOSIS — L089 Local infection of the skin and subcutaneous tissue, unspecified: Secondary | ICD-10-CM | POA: Diagnosis not present

## 2021-09-11 DIAGNOSIS — L658 Other specified nonscarring hair loss: Secondary | ICD-10-CM | POA: Diagnosis not present

## 2021-09-11 DIAGNOSIS — L219 Seborrheic dermatitis, unspecified: Secondary | ICD-10-CM | POA: Diagnosis not present

## 2021-09-21 ENCOUNTER — Ambulatory Visit: Payer: BC Managed Care – PPO | Admitting: Family Medicine

## 2021-09-21 ENCOUNTER — Encounter: Payer: Self-pay | Admitting: Hematology and Oncology

## 2021-10-05 ENCOUNTER — Other Ambulatory Visit: Payer: Self-pay

## 2021-10-05 ENCOUNTER — Encounter: Payer: Self-pay | Admitting: Obstetrics & Gynecology

## 2021-10-05 ENCOUNTER — Ambulatory Visit (INDEPENDENT_AMBULATORY_CARE_PROVIDER_SITE_OTHER): Payer: BC Managed Care – PPO | Admitting: Obstetrics & Gynecology

## 2021-10-05 VITALS — BP 136/83 | HR 65 | Ht 67.0 in | Wt >= 6400 oz

## 2021-10-05 DIAGNOSIS — F419 Anxiety disorder, unspecified: Secondary | ICD-10-CM

## 2021-10-05 DIAGNOSIS — N950 Postmenopausal bleeding: Secondary | ICD-10-CM | POA: Diagnosis not present

## 2021-10-05 DIAGNOSIS — Z1231 Encounter for screening mammogram for malignant neoplasm of breast: Secondary | ICD-10-CM

## 2021-10-05 DIAGNOSIS — R32 Unspecified urinary incontinence: Secondary | ICD-10-CM

## 2021-10-05 NOTE — Progress Notes (Signed)
GYNECOLOGY OFFICE VISIT NOTE  History:   Tracy Mcpherson is a 50 y.o. G1P0 here today for evaluation of urinary incontinence.  She is currently followed by Dr. Christophe Louis [facility]), she thought I was a urogynecologist.  Has been recently evaluated for postmenopausal bleeding, had unremarkable ultrasound and benign endometrial biopsy.  She states that she is having a lot of issues with urinary incontinence. Is having to wear a urinary incontinence pad at all times, states this is "not like a trickle but a gush".  When she has the urge to use the bathroom she is unable to hold it, immediately has to go. She denies any current abnormal vaginal discharge, bleeding, pelvic pain or other concerns.    Past Medical History:  Diagnosis Date   Anemia    Anxiety    Asthma    Constipation    Depression    Epilepsy ([place])    History of gestational diabetes    History of herpes simplex type 2 infection    History of pregnancy induced hypertension    Hx of leukocytosis    mild   Iron deficiency anemia    Joint pain    Lower extremity edema    Migraines    Mini stroke    Morbid obesity (HCC)    Pain in both lower legs    Plantar fasciitis    Prediabetes    Right knee pain    Seasonal allergies    Seizures ([place])    Sleep apnea    Submucous myoma of uterus    Vitamin B 12 deficiency    Vitamin D deficiency     Past Surgical History:  Procedure Laterality Date   CESAREAN SECTION  11-02-2007   CRANIOTOMY FOR TEMPORAL LOBECTOMY     GYNECOLOGIC CRYOSURGERY     IUD REMOVAL  12-16-2009   REMOVAL THYROID CYST  2000    The following portions of the patient's history were reviewed and updated as appropriate: allergies, current medications, past family history, past medical history, past social history, past surgical history and problem list.   Health Maintenance:  Normal pap and negative HRHPV on April 2022.  Normal mammogram over a year ago.  Review of Systems:  Pertinent items noted  in HPI and remainder of comprehensive ROS otherwise negative.  Physical Exam:  BP 136/83   Pulse 65   Ht 5\' 7"  (1.702 m)   Wt (!) 405 lb 11.2 oz (184 kg)   BMI 63.54 kg/m  CONSTITUTIONAL: Well-developed, well-nourished female in no acute distress.  HEENT:  Normocephalic, atraumatic. External right and left ear normal. No scleral icterus.  NECK: Normal range of motion, supple, no masses noted on observation SKIN: No rash noted. Not diaphoretic. No erythema. No pallor. MUSCULOSKELETAL: Normal range of motion. No edema noted. NEUROLOGIC: Alert and oriented to person, place, and time. Normal muscle tone coordination. No cranial nerve deficit noted. PSYCHIATRIC: Normal mood and affect. Normal behavior. Normal judgment and thought content. CARDIOVASCULAR: Normal heart rate noted RESPIRATORY: Effort and breath sounds normal, no problems with respiration noted ABDOMEN: No masses noted. No other overt distention noted.   PELVIC: Deferred     Assessment and Plan:     1. Urinary incontinence in female Urine culture done, will follow up results and manage accordingly. Referred to Urogynecology for further evaluation and management. - Ambulatory referral to Urogynecology - Urine Culture  2. Postmenopausal bleeding Patient is aware that further evaluation/management will be needed if bleeding is recurrent/persistent.  She reports Dr. Landry Mellow was talking to her about possible hysterectomy.  Will continue to follow.  3. Anxiety Increased GAD & scores today. Has been on Buspar 5 mg tid, not helping. Advised to increase to 10 mg.  Patient verbally consented to [facility] services about presenting concerns and psychiatric consultation as appropriate. - Ambulatory referral to [facility]  4. Breast cancer screening by mammogram Mammogram scheduled for patient. - MM Digital Screening; Future Routine preventative health maintenance measures emphasized. Please refer  to After Visit Summary for other counseling recommendations.   Return for any gynecologic concerns.    I spent 20 minutes dedicated to the care of this patient including pre-visit review of records, face to face time with the patient discussing her conditions and treatments and post visit orders.    Verita Schneiders, MD, [place] for [employer], [place]

## 2021-10-07 LAB — URINE CULTURE

## 2021-10-10 ENCOUNTER — Encounter: Payer: Federal, State, Local not specified - PPO | Admitting: Obstetrics & Gynecology

## 2021-10-10 NOTE — BH Specialist Note (Signed)
Integrated Behavioral Health via Telemedicine Visit  10/10/2021 [name] [id number]  Number of [place] visits: 1 Session Start time: 8:19  Session End time: 9:03 Total time:  37  Referring Provider: Verita Schneiders, MD Patient/Family location: Home [facility] Provider location: Center for [facility]  All persons participating in visit: Patient Tracy Mcpherson and [place]   Types of Service: Individual psychotherapy and Video visit  I connected with Lakedra M Leicht and/or Armida Sans M Clute's  n/a  via  Telephone or Video Enabled Telemedicine Application  (Video is Caregility application) and verified that I am speaking with the correct person using two identifiers. Discussed confidentiality: Yes   I discussed the limitations of telemedicine and the availability of in person appointments.  Discussed there is a possibility of technology failure and discussed alternative modes of communication if that failure occurs.  I discussed that engaging in this telemedicine visit, they consent to the provision of behavioral healthcare and the services will be billed under their insurance.  Patient and/or legal guardian expressed understanding and consented to Telemedicine visit: Yes   Presenting Concerns: Patient and/or family reports the following symptoms/concerns: Anxious and depressed, attributed to life stress (financial, weight, health, loss of marriage); uses deep breathing daily to cope; open to implementing additional self-coping strategy, along with medication (Pt does not feel Buspar is helping).  Duration of problem: Increasing over time; Severity of problem:  moderately severe  Patient and/or Family's Strengths/Protective Factors: Social and Emotional competence, Concrete supports in place (healthy food, safe environments, etc.), and Sense of purpose  Goals Addressed: Patient will:  Reduce symptoms of: anxiety,  depression, and stress   Increase knowledge and/or ability of: coping skills   Demonstrate ability to: Increase healthy adjustment to current life circumstances  Progress towards Goals: Ongoing  Interventions: Interventions utilized:  Veterinary surgeon, Sleep Hygiene, and Psychoeducation and/or Health Education Standardized Assessments completed: GAD-7 and PHQ 9  Patient and/or Family Response: Pt agrees with treatment plan  Assessment: Patient currently experiencing Adjustment disorder with mixed anxious and depressed mood and Psychosocial stress.   Patient may benefit from psychoeducation and brief therapeutic interventions regarding coping with symptoms of anxiety, depression, life stress .  Plan: Follow up with behavioral health clinician on : Three weeks; Call Breton Berns at [phone], as needed. Behavioral recommendations:  -Continue taking BH medication as prescribed by PCP; will be notified with any change to medication -Continue using deep breathing exercises daily; consider adding sleep sounds with bedtime deep breathing -Begin Worry Time strategy, as discussed; practice daily for three weeks Referral(s): [place] (In Clinic)  I discussed the assessment and treatment plan with the patient and/or parent/guardian. They were provided an opportunity to ask questions and all were answered. They agreed with the plan and demonstrated an understanding of the instructions.   They were advised to call back or seek an in-person evaluation if the symptoms worsen or if the condition fails to improve as anticipated.  [name], LCSW  Depression screen [facility] 2/9 10/19/2021 10/05/2021 04/12/2020  Decreased Interest 3 3 3   Down, Depressed, Hopeless 1 3 3   PHQ - 2 Score 4 6 6   Altered sleeping 3 3 2   Tired, decreased energy 3 3 3   Change in appetite 3 3 3   Feeling bad or failure about yourself  3 3 0  Trouble concentrating 0 2 1  Moving slowly or  fidgety/restless 0 2 3  Suicidal thoughts 0  0 0  PHQ-9 Score 16 22 18   Difficult doing work/chores - - Extremely dIfficult   GAD 7 : Generalized Anxiety Score 10/19/2021 10/05/2021  Nervous, Anxious, on Edge 1 0  Control/stop worrying 2 1  Worry too much - different things 2 1  Trouble relaxing 2 1  Restless 0 0  Easily annoyed or irritable 0 1  Afraid - awful might happen 1 0  Total GAD 7 Score 8 4

## 2021-10-11 ENCOUNTER — Ambulatory Visit: Payer: BC Managed Care – PPO | Admitting: Family Medicine

## 2021-10-12 ENCOUNTER — Telehealth: Payer: BC Managed Care – PPO | Admitting: Family Medicine

## 2021-10-19 ENCOUNTER — Ambulatory Visit (INDEPENDENT_AMBULATORY_CARE_PROVIDER_SITE_OTHER): Payer: BC Managed Care – PPO | Admitting: Clinical

## 2021-10-19 ENCOUNTER — Other Ambulatory Visit: Payer: Self-pay | Admitting: Obstetrics & Gynecology

## 2021-10-19 DIAGNOSIS — F419 Anxiety disorder, unspecified: Secondary | ICD-10-CM

## 2021-10-19 DIAGNOSIS — F4323 Adjustment disorder with mixed anxiety and depressed mood: Secondary | ICD-10-CM | POA: Diagnosis not present

## 2021-10-19 MED ORDER — HYDROXYZINE PAMOATE 50 MG PO CAPS: 50.0000 mg | ORAL_CAPSULE | Freq: Three times a day (TID) | ORAL | 2 refills | Status: DC | PRN

## 2021-10-19 NOTE — Patient Instructions (Signed)
[facility] [address] [place], [place] [address] [phone] (main office) [phone] (Cherilyn Sautter's office)  /Emotional Wellbeing Apps and Websites Here are a few free apps meant to help you to help yourself.  To find, try searching on the internet to see if the app is offered on Apple/Android devices. If your first choice doesn't come up on your device, the good news is that there are many choices! Play around with different apps to see which ones are helpful to you.    Calm This is an app meant to help increase calm feelings. Includes info, strategies, and tools for tracking your feelings.      Calm Harm  This app is meant to help with self-harm. Provides many 5-minute or 15-min coping strategies for doing instead of hurting yourself.       Healthy Minds Health Minds is a problem-solving tool to help deal with emotions and cope with stress you encounter wherever you are.      MindShift This app can help people cope with anxiety. Rather than trying to avoid anxiety, you can make an important shift and face it.      MY3  MY3 features a support system, safety plan and resources with the goal of offering a tool to use in a time of need.       My Life My Voice  This mood journal offers a simple solution for tracking your thoughts, feelings and moods. Animated emoticons can help identify your mood.       Relax Melodies Designed to help with sleep, on this app you can mix sounds and meditations for relaxation.      Smiling Mind Smiling Mind is meditation made easy: it's a simple tool that helps put a smile on your mind.        Stop, Breathe & Think  A friendly, simple guide for people through meditations for mindfulness and compassion.  Stop, Breathe and Think Kids Enter your current feelings and choose a "mission" to help you cope. Offers videos for certain moods instead of just sound recordings.       Team  Orange The goal of this tool is to help teens change how they think, act, and react. This app helps you focus on your own good feelings and experiences.      The Virtual Hope Box The Virtual Hope Box (VHB) contains simple tools to help patients with coping, relaxation, distraction, and positive thinking.     

## 2021-11-06 ENCOUNTER — Other Ambulatory Visit: Payer: Self-pay | Admitting: Family Medicine

## 2021-11-07 ENCOUNTER — Other Ambulatory Visit: Payer: Self-pay | Admitting: Family Medicine

## 2021-11-07 DIAGNOSIS — Z1231 Encounter for screening mammogram for malignant neoplasm of breast: Secondary | ICD-10-CM

## 2021-11-08 ENCOUNTER — Encounter: Payer: Self-pay | Admitting: Hematology and Oncology

## 2021-11-08 ENCOUNTER — Ambulatory Visit: Payer: BC Managed Care – PPO | Admitting: Family Medicine

## 2021-11-10 ENCOUNTER — Ambulatory Visit (INDEPENDENT_AMBULATORY_CARE_PROVIDER_SITE_OTHER): Payer: BC Managed Care – PPO | Admitting: Family Medicine

## 2021-11-10 ENCOUNTER — Encounter: Payer: Self-pay | Admitting: Family Medicine

## 2021-11-10 ENCOUNTER — Encounter: Payer: Self-pay | Admitting: Hematology and Oncology

## 2021-11-10 VITALS — BP 150/100 | Ht 67.0 in | Wt >= 6400 oz

## 2021-11-10 DIAGNOSIS — M722 Plantar fascial fibromatosis: Secondary | ICD-10-CM

## 2021-11-10 DIAGNOSIS — M533 Sacrococcygeal disorders, not elsewhere classified: Secondary | ICD-10-CM | POA: Diagnosis not present

## 2021-11-10 MED ORDER — PREDNISONE 5 MG PO TABS: ORAL_TABLET | ORAL | 0 refills | Status: DC

## 2021-11-10 NOTE — Assessment & Plan Note (Signed)
Acute on chronic in nature.  Has flatter feet that is likely contributing to her symptoms. -Counseled on home exercise therapy and supportive care. -Green sport insoles. -Could consider physical therapy or injection.

## 2021-11-10 NOTE — Progress Notes (Signed)
°  Tracy Mcpherson - 51 y.o. female MRN [id number]  Date of birth: 1971/03/22  SUBJECTIVE:  Including CC & ROS.  No chief complaint on file.   Tracy Mcpherson is a 51 y.o. female that is presenting with acutely occurring low back pain with pain down each leg as well as right foot pain.  She has a history of Plan[place] fasciitis.  The back pain is occurring over the past few weeks.  No improvement with diclofenac.  Seems to be worse when she is getting up from a seated position.  Review of the lumbar spine MRI from 2010 shows minimal facet changes of her lumbar spine  Review of Systems See HPI   HISTORY: Past Medical, Surgical, Social, and Family History Reviewed & Updated per EMR.   Pertinent Historical Findings include:  Past Medical History:  Diagnosis Date   Anemia    Anxiety    Asthma    Constipation    Depression    Epilepsy ([place])    History of gestational diabetes    History of herpes simplex type 2 infection    History of pregnancy induced hypertension    Hx of leukocytosis    mild   Iron deficiency anemia    Joint pain    Lower extremity edema    Migraines    Mini stroke    Morbid obesity (HCC)    Pain in both lower legs    Plantar fasciitis    Prediabetes    Right knee pain    Seasonal allergies    Seizures (HCC)    Sleep apnea    Submucous myoma of uterus    Vitamin B 12 deficiency    Vitamin D deficiency     Past Surgical History:  Procedure Laterality Date   CESAREAN SECTION  11-02-2007   CRANIOTOMY FOR TEMPORAL LOBECTOMY     GYNECOLOGIC CRYOSURGERY     IUD REMOVAL  12-16-2009   REMOVAL THYROID CYST  2000     PHYSICAL EXAM:  VS: BP (!) 150/100 (BP Location: Right Arm, Patient Position: Sitting)    Ht 5\' 7"  (1.702 m)    Wt (!) 405 lb (183.7 kg)    BMI 63.43 kg/m  Physical Exam Gen: NAD, alert, cooperative with exam, well-appearing MSK:  Neurovascularly intact       ASSESSMENT & PLAN:   Plantar fasciitis, bilateral Acute on chronic in nature.   Has flatter feet that is likely contributing to her symptoms. -Counseled on home exercise therapy and supportive care. -Green sport insoles. -Could consider physical therapy or injection.  Sacroiliac joint dysfunction of right side Having more tenderness on the right side of the SI joint.  Pain down the legs could be stemming from the SI joint as well.  Likely more related to imbalance as opposed to nerve impingement at this time. -Counseled on home exercise therapy and supportive care. -Prednisone. -Could consider physical therapy or injection.

## 2021-11-10 NOTE — Assessment & Plan Note (Signed)
Having more tenderness on the right side of the SI joint.  Pain down the legs could be stemming from the SI joint as well.  Likely more related to imbalance as opposed to nerve impingement at this time. -Counseled on home exercise therapy and supportive care. -Prednisone. -Could consider physical therapy or injection.

## 2021-11-10 NOTE — Patient Instructions (Signed)
Good to see you Please try the exercises  Please alternate heat and ice  Please put the green insoles in the shoes that you're wearing   Please send me a message in MyChart with any questions or updates.  Please see me back in 4 weeks.   --Dr. Raeford Razor
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
## 2021-11-17 ENCOUNTER — Other Ambulatory Visit: Payer: Self-pay | Admitting: Obstetrics and Gynecology

## 2021-11-17 ENCOUNTER — Encounter: Payer: Self-pay | Admitting: Obstetrics and Gynecology

## 2021-11-17 ENCOUNTER — Other Ambulatory Visit: Payer: Self-pay

## 2021-11-17 ENCOUNTER — Ambulatory Visit (INDEPENDENT_AMBULATORY_CARE_PROVIDER_SITE_OTHER): Payer: BC Managed Care – PPO | Admitting: Obstetrics and Gynecology

## 2021-11-17 VITALS — BP 124/82 | HR 69 | Ht 67.0 in | Wt >= 6400 oz

## 2021-11-17 DIAGNOSIS — M62838 Other muscle spasm: Secondary | ICD-10-CM | POA: Diagnosis not present

## 2021-11-17 DIAGNOSIS — N3281 Overactive bladder: Secondary | ICD-10-CM | POA: Diagnosis not present

## 2021-11-17 DIAGNOSIS — R350 Frequency of micturition: Secondary | ICD-10-CM

## 2021-11-17 MED ORDER — MIRABEGRON ER 50 MG PO TB24: 50.0000 mg | ORAL_TABLET | Freq: Every day | ORAL | 5 refills | Status: DC

## 2021-11-28 ENCOUNTER — Inpatient Hospital Stay: Admission: RE | Admit: 2021-11-28 | Payer: BC Managed Care – PPO | Source: Ambulatory Visit

## 2021-12-08 ENCOUNTER — Ambulatory Visit: Payer: BC Managed Care – PPO | Admitting: Family Medicine

## 2021-12-08 ENCOUNTER — Encounter: Payer: Self-pay | Admitting: Hematology and Oncology

## 2021-12-08 DIAGNOSIS — L669 Cicatricial alopecia, unspecified: Secondary | ICD-10-CM | POA: Diagnosis not present

## 2021-12-08 DIAGNOSIS — L659 Nonscarring hair loss, unspecified: Secondary | ICD-10-CM | POA: Diagnosis not present

## 2021-12-08 DIAGNOSIS — L089 Local infection of the skin and subcutaneous tissue, unspecified: Secondary | ICD-10-CM | POA: Diagnosis not present

## 2021-12-11 ENCOUNTER — Ambulatory Visit: Payer: BC Managed Care – PPO | Admitting: Family Medicine

## 2021-12-18 ENCOUNTER — Telehealth: Payer: Self-pay | Admitting: Family Medicine

## 2021-12-18 NOTE — Telephone Encounter (Signed)
Having bleeding and fibroids issues, want to consult with a nurse.

## 2021-12-19 NOTE — Telephone Encounter (Signed)
I called Tracy Mcpherson and left a message I was calling her back to discuss her message she left and she may call back and leave a detailed message on nurse line if she still needs to talk with a nurse. Also stated from message she left I think she probably needs to make an appointment with the doctor she saw in our office before but  may call us to discuss if needed. [name],RN

## 2021-12-20 ENCOUNTER — Other Ambulatory Visit (HOSPITAL_BASED_OUTPATIENT_CLINIC_OR_DEPARTMENT_OTHER): Payer: Self-pay | Admitting: Obstetrics & Gynecology

## 2021-12-20 DIAGNOSIS — Z1231 Encounter for screening mammogram for malignant neoplasm of breast: Secondary | ICD-10-CM

## 2021-12-27 ENCOUNTER — Inpatient Hospital Stay (HOSPITAL_BASED_OUTPATIENT_CLINIC_OR_DEPARTMENT_OTHER): Admission: RE | Admit: 2021-12-27 | Payer: BC Managed Care – PPO | Source: Ambulatory Visit

## 2021-12-27 DIAGNOSIS — Z1231 Encounter for screening mammogram for malignant neoplasm of breast: Secondary | ICD-10-CM

## 2021-12-29 ENCOUNTER — Other Ambulatory Visit: Payer: Self-pay

## 2021-12-29 ENCOUNTER — Encounter: Payer: Self-pay | Admitting: Obstetrics and Gynecology

## 2021-12-29 ENCOUNTER — Ambulatory Visit (INDEPENDENT_AMBULATORY_CARE_PROVIDER_SITE_OTHER): Payer: BC Managed Care – PPO | Admitting: Obstetrics and Gynecology

## 2021-12-29 VITALS — BP 134/78 | HR 67

## 2021-12-29 DIAGNOSIS — N3281 Overactive bladder: Secondary | ICD-10-CM | POA: Diagnosis not present

## 2021-12-29 NOTE — Progress Notes (Signed)
[facility] Return Visit  SUBJECTIVE  History of Present Illness: Tracy Mcpherson is a 50 y.o. female seen in follow-up for overactive bladder. Plan at last visit was to start medication (gemtesa 75mg ). Initially Myrbetriq was ordered but this was not covered by insurance. .   She feels the medication is helping with the urgency, she is able to make it to the bathroom without leakage. She only leaks twice throughout the workday. She is drinking more water now as well. She has also been exercising more.   Has physical therapy set up for March.    Past Medical History: Patient  has a past medical history of Anemia, Anxiety, Asthma, Constipation, Epilepsy ([place]), History of gestational diabetes, History of herpes simplex type 2 infection, History of pregnancy induced hypertension, leukocytosis, Iron deficiency anemia, Joint pain, Lower extremity edema, Migraines, Morbid obesity ([place]), Pain in both lower legs, Plantar fasciitis, Prediabetes, Right knee pain, Seasonal allergies, Seizures ([place]), Sleep apnea, Submucous myoma of uterus, Vitamin B 12 deficiency, and Vitamin D deficiency.   Past Surgical History: She  has a past surgical history that includes Cesarean section (11-02-2007); IUD removal (12-16-2009); REMOVAL THYROID CYST (2000); Gynecologic cryosurgery; and Craniotomy for temporal lobectomy.   Medications: She has a current medication list which includes the following prescription(s): diclofenac, iron-fa-b cmp-c-biot-probiotic, gemtesa, and vitamin d (ergocalciferol).   Allergies: Patient is allergic to flexeril [cyclobenzaprine hcl], lamictal [lamotrigine], morphine and related, percocet [oxycodone-acetaminophen], topiramate, and vimpat [lacosamide].   Social History: Patient  reports that she has never smoked. She has never used smokeless tobacco. She reports that she does not drink alcohol and does not use drugs.      OBJECTIVE     Physical Exam: Vitals:    12/29/21 0927  BP: 134/78  Pulse: 67   Gen: No apparent distress, A&O x 3.  Detailed Urogynecologic Evaluation:  Deferred.    ASSESSMENT AND PLAN    Tracy Mcpherson is a 51 y.o. with:  1. Overactive bladder    - She will continue with the Gemtesa 75mg  - start physical therapy  Follow up in 6 months or sooner if needed  Jaquita Folds, MD  Time spent: I spent 20 minutes dedicated to the care of this patient on the date of this encounter to include pre-visit review of records, face-to-face time with the patient and post visit documentation.

## 2022-01-02 ENCOUNTER — Ambulatory Visit (HOSPITAL_BASED_OUTPATIENT_CLINIC_OR_DEPARTMENT_OTHER): Payer: BC Managed Care – PPO | Admitting: Radiology

## 2022-01-03 ENCOUNTER — Ambulatory Visit: Payer: BC Managed Care – PPO | Admitting: Obstetrics & Gynecology

## 2022-01-03 ENCOUNTER — Encounter: Payer: Self-pay | Admitting: Obstetrics & Gynecology

## 2022-01-09 ENCOUNTER — Encounter: Payer: BC Managed Care – PPO | Attending: Obstetrics and Gynecology | Admitting: Physical Therapy

## 2022-01-15 ENCOUNTER — Other Ambulatory Visit: Payer: Self-pay | Admitting: Family Medicine

## 2022-01-18 ENCOUNTER — Encounter: Payer: BC Managed Care – PPO | Admitting: Physical Therapy

## 2022-01-25 ENCOUNTER — Encounter: Payer: BC Managed Care – PPO | Admitting: Physical Therapy

## 2022-02-01 ENCOUNTER — Encounter: Payer: BC Managed Care – PPO | Admitting: Physical Therapy

## 2022-02-02 ENCOUNTER — Ambulatory Visit (HOSPITAL_BASED_OUTPATIENT_CLINIC_OR_DEPARTMENT_OTHER)
Admission: RE | Admit: 2022-02-02 | Discharge: 2022-02-02 | Disposition: A | Discharge status: Home / Self Care | Payer: BC Managed Care – PPO | Source: Ambulatory Visit | Attending: Obstetrics & Gynecology | Admitting: Obstetrics & Gynecology

## 2022-02-02 ENCOUNTER — Encounter (HOSPITAL_BASED_OUTPATIENT_CLINIC_OR_DEPARTMENT_OTHER): Payer: Self-pay | Admitting: Radiology

## 2022-02-02 DIAGNOSIS — Z1231 Encounter for screening mammogram for malignant neoplasm of breast: Secondary | ICD-10-CM | POA: Diagnosis present

## 2022-02-06 ENCOUNTER — Other Ambulatory Visit: Payer: Self-pay | Admitting: Obstetrics & Gynecology

## 2022-02-06 DIAGNOSIS — R928 Other abnormal and inconclusive findings on diagnostic imaging of breast: Secondary | ICD-10-CM

## 2022-02-23 ENCOUNTER — Ambulatory Visit
Admission: RE | Admit: 2022-02-23 | Discharge: 2022-02-23 | Disposition: A | Discharge status: Home / Self Care | Payer: BC Managed Care – PPO | Source: Ambulatory Visit | Attending: Obstetrics & Gynecology | Admitting: Obstetrics & Gynecology

## 2022-02-23 ENCOUNTER — Other Ambulatory Visit: Payer: Self-pay | Admitting: Obstetrics & Gynecology

## 2022-02-23 DIAGNOSIS — N6310 Unspecified lump in the right breast, unspecified quadrant: Secondary | ICD-10-CM

## 2022-02-23 DIAGNOSIS — R928 Other abnormal and inconclusive findings on diagnostic imaging of breast: Secondary | ICD-10-CM

## 2022-03-05 ENCOUNTER — Other Ambulatory Visit: Payer: BC Managed Care – PPO

## 2022-03-05 DIAGNOSIS — L219 Seborrheic dermatitis, unspecified: Secondary | ICD-10-CM | POA: Diagnosis not present

## 2022-03-05 DIAGNOSIS — L089 Local infection of the skin and subcutaneous tissue, unspecified: Secondary | ICD-10-CM | POA: Diagnosis not present

## 2022-03-05 DIAGNOSIS — L669 Cicatricial alopecia, unspecified: Secondary | ICD-10-CM | POA: Diagnosis not present

## 2022-03-05 DIAGNOSIS — L658 Other specified nonscarring hair loss: Secondary | ICD-10-CM | POA: Diagnosis not present

## 2022-03-15 ENCOUNTER — Other Ambulatory Visit: Payer: Self-pay | Admitting: Obstetrics & Gynecology

## 2022-03-15 ENCOUNTER — Other Ambulatory Visit: Payer: BC Managed Care – PPO

## 2022-03-15 ENCOUNTER — Encounter: Payer: Self-pay | Admitting: Hematology and Oncology

## 2022-03-15 ENCOUNTER — Ambulatory Visit
Admission: RE | Admit: 2022-03-15 | Discharge: 2022-03-15 | Disposition: A | Discharge status: Home / Self Care | Payer: Federal, State, Local not specified - PPO | Source: Ambulatory Visit | Attending: Obstetrics & Gynecology | Admitting: Obstetrics & Gynecology

## 2022-03-15 DIAGNOSIS — N6001 Solitary cyst of right breast: Secondary | ICD-10-CM | POA: Diagnosis not present

## 2022-03-15 DIAGNOSIS — N6310 Unspecified lump in the right breast, unspecified quadrant: Secondary | ICD-10-CM

## 2022-03-28 DIAGNOSIS — G40109 Localization-related (focal) (partial) symptomatic epilepsy and epileptic syndromes with simple partial seizures, not intractable, without status epilepticus: Secondary | ICD-10-CM | POA: Diagnosis not present

## 2022-03-28 DIAGNOSIS — G40219 Localization-related (focal) (partial) symptomatic epilepsy and epileptic syndromes with complex partial seizures, intractable, without status epilepticus: Secondary | ICD-10-CM | POA: Diagnosis not present

## 2022-03-28 DIAGNOSIS — G4733 Obstructive sleep apnea (adult) (pediatric): Secondary | ICD-10-CM | POA: Diagnosis not present

## 2022-03-28 DIAGNOSIS — Z9989 Dependence on other enabling machines and devices: Secondary | ICD-10-CM | POA: Diagnosis not present

## 2022-04-11 DIAGNOSIS — L669 Cicatricial alopecia, unspecified: Secondary | ICD-10-CM | POA: Diagnosis not present

## 2022-04-11 DIAGNOSIS — Z9889 Other specified postprocedural states: Secondary | ICD-10-CM | POA: Diagnosis not present

## 2022-04-11 DIAGNOSIS — L658 Other specified nonscarring hair loss: Secondary | ICD-10-CM | POA: Diagnosis not present

## 2022-04-11 DIAGNOSIS — L089 Local infection of the skin and subcutaneous tissue, unspecified: Secondary | ICD-10-CM | POA: Diagnosis not present

## 2022-04-11 DIAGNOSIS — L659 Nonscarring hair loss, unspecified: Secondary | ICD-10-CM | POA: Diagnosis not present

## 2022-04-12 ENCOUNTER — Other Ambulatory Visit: Payer: Self-pay | Admitting: Obstetrics & Gynecology

## 2022-04-12 DIAGNOSIS — F419 Anxiety disorder, unspecified: Secondary | ICD-10-CM

## 2022-04-20 ENCOUNTER — Encounter: Payer: Self-pay | Admitting: Medical

## 2022-04-20 ENCOUNTER — Encounter: Payer: Self-pay | Admitting: Hematology and Oncology

## 2022-04-20 ENCOUNTER — Ambulatory Visit (INDEPENDENT_AMBULATORY_CARE_PROVIDER_SITE_OTHER): Payer: Federal, State, Local not specified - PPO | Admitting: Medical

## 2022-04-20 VITALS — BP 130/83 | HR 71

## 2022-04-20 DIAGNOSIS — N950 Postmenopausal bleeding: Secondary | ICD-10-CM | POA: Diagnosis not present

## 2022-04-20 DIAGNOSIS — Z3202 Encounter for pregnancy test, result negative: Secondary | ICD-10-CM

## 2022-04-20 DIAGNOSIS — Z1331 Encounter for screening for depression: Secondary | ICD-10-CM

## 2022-04-20 DIAGNOSIS — D251 Intramural leiomyoma of uterus: Secondary | ICD-10-CM

## 2022-04-20 LAB — POCT PREGNANCY, URINE: Preg Test, Ur: NEGATIVE

## 2022-04-20 NOTE — Progress Notes (Signed)
Patient pelvic US scheduled for at 05/03/22 at 2 PM. Patient notified.

## 2022-04-20 NOTE — Progress Notes (Signed)
History:  Ms. Tracy Mcpherson is a 51 y.o. G1P1001 who presents to clinic today for post-menopausal bleeding. The patient was confirmed menopausal in 2015 with elevated FSH noted in Care Everywhere. She has been previously evaluated for PMB in our office 02/2021. Endometrial biopsy was normal and US showed multiple fibroids. The patient states that she has had 4 episodes of bleeding lasting about 1 week, heavy at times with small clots in 10/2020, 02/2021, 12/2021 and most recently 2 days ago. She has moderate pelvic pain associated with the bleeding and takes Tylenol which helps.   The following portions of the patient's history were reviewed and updated as appropriate: allergies, current medications, family history, past medical history, social history, past surgical history and problem list.  Review of Systems:  Review of Systems  Constitutional:  Negative for fever and malaise/fatigue.  Gastrointestinal:  Negative for abdominal pain.  Genitourinary:  Negative for urgency.       Neg - discharge, pelvic pain + vaginal bleeding      Objective:  Physical Exam BP 130/83   Pulse 71   LMP 10/13/2019 (Within Days)  Physical Exam Vitals and nursing note reviewed. Exam conducted with a chaperone present.  Constitutional:      General: She is not in acute distress.    Appearance: Normal appearance. She is well-developed. She is obese.  HENT:     Head: Normocephalic and atraumatic.  Cardiovascular:     Rate and Rhythm: Normal rate.  Pulmonary:     Effort: Pulmonary effort is normal.  Abdominal:     General: Abdomen is flat. Bowel sounds are normal. There is no distension.     Palpations: Abdomen is soft. There is no mass.     Tenderness: There is no abdominal tenderness. There is no guarding or rebound.  Genitourinary:    General: Normal vulva.     Uterus: Tender. Not enlarged (exam limited by body habitus).   Skin:    General: Skin is warm and dry.     Findings: No erythema.   Neurological:     Mental Status: She is alert and oriented to person, place, and time.  Psychiatric:        Mood and Affect: Mood normal.       Labs and Imaging Results for orders placed or performed in visit on 04/20/22 (from the past 24 hour(s))  Pregnancy, urine POC     Status: None   Collection Time: 04/20/22 11:36 AM  Result Value Ref Range   Preg Test, Ur NEGATIVE NEGATIVE    Health Maintenance Due  Topic Date Due   COVID-19 Vaccine (1) Never done   HIV Screening  Never done   Hepatitis C Screening  Never done   COLONOSCOPY (Pts 45-43yr Insurance coverage will need to be confirmed)  Never done    Labs, imaging and previous visits in Epic and Care Everywhere reviewed at length and confirmed with patient  Assessment & Plan:  1. Intramural leiomyoma of uterus - [place] COMPLETE WITH TRANSVAGINAL; Future  2. Positive depression screening - [place] 3. Post-menopausal bleeding - [place]LV[place] COMPLETE WITH TRANSVAGINAL; Future - Consulted with Dr. ARoselie Awkward recommends [place] evaluate fibroids and endometrium, if endometrium is thin, biopsy may not be needed, but could be obtained during surgical consult at next visit - Patient will return in 2-4 weeks after [place] results and surgical consult   Approximately 31 minutes of total time was spent  with this patient on chart review, history taking, consult with Attending, coordination of care, exam, discussion of plan of care with shared decision making, physical exam and documentation.   Luvenia Redden, PA-C 04/20/2022 1:21 PM

## 2022-04-23 DIAGNOSIS — R0789 Other chest pain: Secondary | ICD-10-CM | POA: Diagnosis not present

## 2022-04-23 NOTE — BH Specialist Note (Signed)
Pt did not arrive to video visit and did not answer the phone; Unable to leave message as voicemail is full; left MyChart message for patient.

## 2022-04-25 DIAGNOSIS — E661 Drug-induced obesity: Secondary | ICD-10-CM | POA: Diagnosis not present

## 2022-04-25 DIAGNOSIS — R7303 Prediabetes: Secondary | ICD-10-CM | POA: Diagnosis not present

## 2022-04-25 DIAGNOSIS — D649 Anemia, unspecified: Secondary | ICD-10-CM | POA: Diagnosis not present

## 2022-04-25 DIAGNOSIS — E559 Vitamin D deficiency, unspecified: Secondary | ICD-10-CM | POA: Diagnosis not present

## 2022-04-26 ENCOUNTER — Encounter: Payer: Self-pay | Admitting: Hematology and Oncology

## 2022-05-03 ENCOUNTER — Ambulatory Visit: Payer: Federal, State, Local not specified - PPO

## 2022-05-07 ENCOUNTER — Ambulatory Visit: Payer: Federal, State, Local not specified - PPO | Admitting: Clinical

## 2022-05-07 DIAGNOSIS — Z91199 Patient's noncompliance with other medical treatment and regimen due to unspecified reason: Secondary | ICD-10-CM

## 2022-05-09 ENCOUNTER — Ambulatory Visit: Payer: Federal, State, Local not specified - PPO | Admitting: Family Medicine

## 2022-05-16 DIAGNOSIS — L659 Nonscarring hair loss, unspecified: Secondary | ICD-10-CM | POA: Diagnosis not present

## 2022-05-16 DIAGNOSIS — L669 Cicatricial alopecia, unspecified: Secondary | ICD-10-CM | POA: Diagnosis not present

## 2022-05-16 DIAGNOSIS — L658 Other specified nonscarring hair loss: Secondary | ICD-10-CM | POA: Diagnosis not present

## 2022-05-16 DIAGNOSIS — L089 Local infection of the skin and subcutaneous tissue, unspecified: Secondary | ICD-10-CM | POA: Diagnosis not present

## 2022-05-21 ENCOUNTER — Inpatient Hospital Stay: Admission: RE | Admit: 2022-05-21 | Payer: Federal, State, Local not specified - PPO | Source: Ambulatory Visit

## 2022-05-23 DIAGNOSIS — F439 Reaction to severe stress, unspecified: Secondary | ICD-10-CM | POA: Diagnosis not present

## 2022-05-23 DIAGNOSIS — F418 Other specified anxiety disorders: Secondary | ICD-10-CM | POA: Diagnosis not present

## 2022-05-23 DIAGNOSIS — Z566 Other physical and mental strain related to work: Secondary | ICD-10-CM | POA: Diagnosis not present

## 2022-05-25 ENCOUNTER — Ambulatory Visit (HOSPITAL_BASED_OUTPATIENT_CLINIC_OR_DEPARTMENT_OTHER)
Admission: RE | Admit: 2022-05-25 | Discharge: 2022-05-25 | Disposition: A | Discharge status: Home / Self Care | Payer: Federal, State, Local not specified - PPO | Source: Ambulatory Visit | Attending: Medical | Admitting: Medical

## 2022-05-25 DIAGNOSIS — N950 Postmenopausal bleeding: Secondary | ICD-10-CM | POA: Diagnosis not present

## 2022-05-25 DIAGNOSIS — D251 Intramural leiomyoma of uterus: Secondary | ICD-10-CM | POA: Insufficient documentation

## 2022-05-25 DIAGNOSIS — D259 Leiomyoma of uterus, unspecified: Secondary | ICD-10-CM | POA: Diagnosis not present

## 2022-05-31 ENCOUNTER — Ambulatory Visit (INDEPENDENT_AMBULATORY_CARE_PROVIDER_SITE_OTHER): Payer: Federal, State, Local not specified - PPO | Admitting: Obstetrics and Gynecology

## 2022-05-31 ENCOUNTER — Encounter: Payer: Self-pay | Admitting: Obstetrics and Gynecology

## 2022-05-31 ENCOUNTER — Other Ambulatory Visit: Payer: Self-pay

## 2022-05-31 VITALS — BP 133/80 | HR 84

## 2022-05-31 DIAGNOSIS — G43009 Migraine without aura, not intractable, without status migrainosus: Secondary | ICD-10-CM | POA: Insufficient documentation

## 2022-05-31 DIAGNOSIS — K219 Gastro-esophageal reflux disease without esophagitis: Secondary | ICD-10-CM | POA: Insufficient documentation

## 2022-05-31 DIAGNOSIS — J45909 Unspecified asthma, uncomplicated: Secondary | ICD-10-CM | POA: Insufficient documentation

## 2022-05-31 DIAGNOSIS — L659 Nonscarring hair loss, unspecified: Secondary | ICD-10-CM | POA: Insufficient documentation

## 2022-05-31 DIAGNOSIS — N950 Postmenopausal bleeding: Secondary | ICD-10-CM | POA: Insufficient documentation

## 2022-05-31 NOTE — Progress Notes (Signed)
  CC: postmenopausal bleeding Subjective:    Patient ID: Tracy Mcpherson, female    DOB: 08-29-1971, 51 y.o.   MRN: [id number]  HPI 51 yo G1P1 seen for discussion of possible postmenopausal bleeding.  Per pt she has been postmenopausal since around 2015 as confirmed with [facility].  Could not find this lab value.  Pt does not recall any history of PCOS  with missed menses.  She did have a progesterone IUD which was effective, but amenorrhea persisted after it was removed.  She had an endometrial biopsy in 2022 which showed no atypia or carcinoma.  She has had 3-4 rounds of "postmenopausal" bleeding over the last year.  Ultrasound shows thickened endometrial stripe and multiple fibroids.   Review of Systems     Objective:   Physical Exam Vitals:   05/31/22 1637  BP: 133/80  Pulse: 84         Assessment & Plan:   1. Postmenopausal bleeding Will recheck [place] to confirm menopause. Difficult complaint due to high BMI possibly contributing to amenorrhea and irregular bleeding. Pt is at high risk for endometrial hyperplasia/carcinoma. Will discuss with other team members/surgeons for plan At this time hysteroscopy D and C for improved sampling  plus/ minus [place] or uterine ablation may be principal treatment.   Pt is poor surgical candidate due to morbid obesity and hx of cesarean section. F/u with pt in 4-5 weeks with virtual visit - Follicle stimulating hormone  I spent 20 minutes dedicated to the care of this patient including previsit review of records, face to face time with the patient discussing evaluation, treatment options and post visit testing.   Griffin Basil, MD Faculty Attending, [facility]

## 2022-06-01 LAB — FOLLICLE STIMULATING HORMONE: FSH: 37.9 m[IU]/mL

## 2022-06-13 ENCOUNTER — Encounter (INDEPENDENT_AMBULATORY_CARE_PROVIDER_SITE_OTHER): Payer: Self-pay

## 2022-06-19 ENCOUNTER — Ambulatory Visit: Payer: Federal, State, Local not specified - PPO | Admitting: Family Medicine

## 2022-06-20 DIAGNOSIS — M9903 Segmental and somatic dysfunction of lumbar region: Secondary | ICD-10-CM | POA: Diagnosis not present

## 2022-06-20 DIAGNOSIS — M9902 Segmental and somatic dysfunction of thoracic region: Secondary | ICD-10-CM | POA: Diagnosis not present

## 2022-06-20 DIAGNOSIS — M9901 Segmental and somatic dysfunction of cervical region: Secondary | ICD-10-CM | POA: Diagnosis not present

## 2022-06-20 DIAGNOSIS — M9905 Segmental and somatic dysfunction of pelvic region: Secondary | ICD-10-CM | POA: Diagnosis not present

## 2022-06-22 ENCOUNTER — Ambulatory Visit: Payer: Federal, State, Local not specified - PPO | Admitting: Family Medicine

## 2022-06-22 ENCOUNTER — Encounter: Payer: Self-pay | Admitting: Family Medicine

## 2022-06-22 VITALS — BP 150/94 | Ht 67.0 in | Wt >= 6400 oz

## 2022-06-22 DIAGNOSIS — M222X1 Patellofemoral disorders, right knee: Secondary | ICD-10-CM

## 2022-06-22 DIAGNOSIS — M222X2 Patellofemoral disorders, left knee: Secondary | ICD-10-CM

## 2022-06-22 MED ORDER — DICLOFENAC SODIUM 75 MG PO TBEC: 75.0000 mg | DELAYED_RELEASE_TABLET | Freq: Two times a day (BID) | ORAL | 3 refills | Status: DC | PRN

## 2022-06-22 NOTE — Progress Notes (Signed)
  Tracy Mcpherson - 51 y.o. female MRN [id number]  Date of birth: 1971-06-25  SUBJECTIVE:  Including CC & ROS.  No chief complaint on file.   Tracy Mcpherson is a 51 y.o. female that is presenting with acute on chronic bilateral knee pain.  Pain is worse with walking.  It is globally on the anterior aspect of each knee.  Did get improvement with prednisone and diclofenac.   Review of Systems See HPI   HISTORY: Past Medical, Surgical, Social, and Family History Reviewed & Updated per EMR.   Pertinent Historical Findings include:  Past Medical History:  Diagnosis Date   Anemia    Anxiety    Asthma    Constipation    Epilepsy ([place])    Fibroids    History of gestational diabetes    History of herpes simplex type 2 infection    History of pregnancy induced hypertension    Hx of leukocytosis    mild   Iron deficiency anemia    Joint pain    Lower extremity edema    Migraines    Morbid obesity ([facility])    Pain in both lower legs    Plantar fasciitis    Prediabetes    Right knee pain    Seasonal allergies    Seizures ([place])    Sleep apnea    Vitamin B 12 deficiency    Vitamin D deficiency     Past Surgical History:  Procedure Laterality Date   CESAREAN SECTION  11-02-2007   CRANIOTOMY FOR TEMPORAL LOBECTOMY     GYNECOLOGIC CRYOSURGERY     IUD REMOVAL  12-16-2009   REMOVAL THYROID CYST  2000     PHYSICAL EXAM:  VS: BP (!) 150/94 (BP Location: Right Arm, Patient Position: Sitting)   Ht '5\' 7"'$  (1.702 m)   Wt (!) 402 lb (182.3 kg)   LMP 10/13/2019 (Within Days)   BMI 62.96 kg/m  Physical Exam Gen: NAD, alert, cooperative with exam, well-appearing MSK:  Neurovascularly intact       ASSESSMENT & PLAN:   Patellofemoral pain syndrome of both knees Acute on chronic in nature.  Previous scans and imaging have been reassuring.  Has tried insoles with little improvement. -Counseled on home exercise therapy and supportive care. -Diclofenac -Referral to physical  therapy. -Consider injections or updated imaging.

## 2022-06-22 NOTE — Patient Instructions (Signed)
Good to see you Please try the exercises  Please try voltaren rub over the counter  I have made a referral to physical therapy   Please send me a message in MyChart with any questions or updates.  Please see me back in 4-6 weeks.   --Dr. Raeford Razor

## 2022-06-22 NOTE — Assessment & Plan Note (Signed)
Acute on chronic in nature.  Previous scans and imaging have been reassuring.  Has tried insoles with little improvement. -Counseled on home exercise therapy and supportive care. -Diclofenac -Referral to physical therapy. -Consider injections or updated imaging.

## 2022-06-25 ENCOUNTER — Telehealth (INDEPENDENT_AMBULATORY_CARE_PROVIDER_SITE_OTHER): Payer: Federal, State, Local not specified - PPO | Admitting: Obstetrics and Gynecology

## 2022-06-25 DIAGNOSIS — Z91199 Patient's noncompliance with other medical treatment and regimen due to unspecified reason: Secondary | ICD-10-CM

## 2022-06-25 NOTE — Progress Notes (Signed)
   Complete physical exam  Patient: Tracy Mcpherson   DOB: 08/25/1997   51 y.o. Female  MRN: [id number]  Subjective:    No chief complaint on file.   Tracy Mcpherson is a 51 y.o. female who presents today for a complete physical exam. She reports consuming a {diet types:17450} diet. {types:19826} She generally feels {DESC; WELL/FAIRLY WELL/POORLY:18703}. She reports sleeping {DESC; WELL/FAIRLY WELL/POORLY:18703}. She {does/does not:200015} have additional problems to discuss today.    Most recent fall risk assessment:    05/02/2022   10:42 AM  Fall Risk   Falls in the past year? 0  Number falls in past yr: 0  Injury with Fall? 0  Risk for fall due to : No Fall Risks  Follow up Falls evaluation completed     Most recent depression screenings:    05/02/2022   10:42 AM 03/23/2021   10:46 AM  PHQ 2/9 Scores  PHQ - 2 Score 0 0  PHQ- 9 Score 5     {VISON DENTAL STD PSA (Optional):27386}  {History (Optional):23778}  Patient Care Team: Jessup, Joy, NP as PCP - General (Nurse Practitioner)   Outpatient Medications Prior to Visit  Medication Sig   fluticasone (FLONASE) 50 MCG/ACT nasal spray Place 2 sprays into both nostrils in the morning and at bedtime. After 7 days, reduce to once daily.   norgestimate-ethinyl estradiol (SPRINTEC 28) 0.25-35 MG-MCG tablet Take 1 tablet by mouth daily.   Nystatin POWD Apply liberally to affected area 2 times per day   spironolactone (ALDACTONE) 100 MG tablet Take 1 tablet (100 mg total) by mouth daily.   No facility-administered medications prior to visit.    ROS        Objective:     There were no vitals taken for this visit. {Vitals History (Optional):23777}  Physical Exam   No results found for any visits on 06/07/22. {Show previous labs (optional):23779}    Assessment & Plan:    Routine Health Maintenance and Physical Exam  Immunization History  Administered Date(s) Administered   DTaP 11/08/1999, 01/04/2000,  03/14/2000, 11/28/2000, 06/13/2004   Hepatitis A 04/09/2008, 04/15/2009   Hepatitis B 08/26/1999, 10/03/1999, 03/14/2000   HiB (PRP-OMP) 11/08/1999, 01/04/2000, 03/14/2000, 11/28/2000   IPV 11/08/1999, 01/04/2000, 09/02/2000, 06/13/2004   Influenza,inj,Quad PF,6+ Mos 07/16/2014   Influenza-Unspecified 10/15/2012   MMR 09/02/2001, 06/13/2004   Meningococcal Polysaccharide 04/14/2012   Pneumococcal Conjugate-13 11/28/2000   Pneumococcal-Unspecified 03/14/2000, 05/28/2000   Tdap 04/14/2012   Varicella 09/02/2000, 04/09/2008    Health Maintenance  Topic Date Due   HIV Screening  Never done   Hepatitis C Screening  Never done   INFLUENZA VACCINE  06/05/2022   PAP-Cervical Cytology Screening  06/07/2022 (Originally 08/24/2020)   PAP SMEAR-Modifier  06/07/2022 (Originally 08/24/2020)   TETANUS/TDAP  06/07/2022 (Originally 04/14/2022)   HPV VACCINES  Discontinued   COVID-19 Vaccine  Discontinued    Discussed health benefits of physical activity, and encouraged her to engage in regular exercise appropriate for her age and condition.  Problem List Items Addressed This Visit   None Visit Diagnoses     Annual physical exam    -  Primary   Cervical cancer screening       Need for Tdap vaccination          No follow-ups on file.     Joy Jessup, NP   

## 2022-06-27 ENCOUNTER — Ambulatory Visit: Payer: Federal, State, Local not specified - PPO

## 2022-06-28 ENCOUNTER — Ambulatory Visit: Payer: Federal, State, Local not specified - PPO | Attending: Family Medicine

## 2022-06-28 ENCOUNTER — Other Ambulatory Visit: Payer: Self-pay

## 2022-06-28 DIAGNOSIS — M25562 Pain in left knee: Secondary | ICD-10-CM | POA: Insufficient documentation

## 2022-06-28 DIAGNOSIS — G8929 Other chronic pain: Secondary | ICD-10-CM | POA: Diagnosis not present

## 2022-06-28 DIAGNOSIS — M5459 Other low back pain: Secondary | ICD-10-CM | POA: Insufficient documentation

## 2022-06-28 DIAGNOSIS — R262 Difficulty in walking, not elsewhere classified: Secondary | ICD-10-CM | POA: Insufficient documentation

## 2022-06-28 DIAGNOSIS — M222X2 Patellofemoral disorders, left knee: Secondary | ICD-10-CM | POA: Insufficient documentation

## 2022-06-28 DIAGNOSIS — M222X1 Patellofemoral disorders, right knee: Secondary | ICD-10-CM | POA: Diagnosis not present

## 2022-06-28 DIAGNOSIS — M6281 Muscle weakness (generalized): Secondary | ICD-10-CM | POA: Diagnosis not present

## 2022-06-28 DIAGNOSIS — M25561 Pain in right knee: Secondary | ICD-10-CM | POA: Diagnosis not present

## 2022-06-28 NOTE — Therapy (Addendum)
OUTPATIENT PHYSICAL THERAPY LOWER EXTREMITY EVALUATION/ DISCHARGE SUMMARY   Patient Name: Tracy Mcpherson MRN: [id number] DOB:[birth year], 51 y.o., female Today's Date: 06/28/2022   PT End of Session - 06/28/22 1854     Visit Number 1    Number of Visits 9    Date for PT Re-Evaluation 08/30/22    Authorization Type BCBS    Authorization Time Period FOTO v6, v10    Progress Note Due on Visit 10    PT Start Time 1835    PT Stop Time 1915    PT Time Calculation (min) 40 min    Activity Tolerance Patient tolerated treatment well    Behavior During Therapy WFL for tasks assessed/performed             Past Medical History:  Diagnosis Date   Anemia    Anxiety    Asthma    Constipation    Epilepsy ([place])    Fibroids    History of gestational diabetes    History of herpes simplex type 2 infection    History of pregnancy induced hypertension    Hx of leukocytosis    mild   Iron deficiency anemia    Joint pain    Lower extremity edema    Migraines    Morbid obesity ([facility])    Pain in both lower legs    Plantar fasciitis    Prediabetes    Right knee pain    Seasonal allergies    Seizures ([place])    Sleep apnea    Vitamin B 12 deficiency    Vitamin D deficiency    Past Surgical History:  Procedure Laterality Date   CESAREAN SECTION  11-02-2007   CRANIOTOMY FOR TEMPORAL LOBECTOMY     GYNECOLOGIC CRYOSURGERY     IUD REMOVAL  12-16-2009   REMOVAL THYROID CYST  2000   Patient Active Problem List   Diagnosis Date Noted   Alopecia 05/31/2022   Postmenopausal bleeding 05/31/2022   Asthma without status asthmaticus 05/31/2022   Gastroesophageal reflux disease 05/31/2022   Common migraine 05/31/2022   Sacroiliac joint dysfunction of right side 11/10/2021   Plantar fasciitis, bilateral 11/10/2021   Patellofemoral pain syndrome of both knees 08/21/2021   Vitamin D deficiency 05/12/2020   Prediabetes 05/12/2020   Class 3 severe obesity with serious comorbidity and body  mass index (BMI) of 50.0 to 59.9 in adult ([place]) 05/12/2020   Unspecified deficiency anemia 02/20/2012   Seizure disorder ([place]) 02/20/2012   Leucocytosis 02/20/2012    PCP: Jonathon Jordan, MD  REFERRING PROVIDER: Rosemarie Ax, MD  REFERRING DIAG: M22.2X1,M22.2X2 (ICD-10-CM) - Patellofemoral pain syndrome of both knees  THERAPY DIAG:  Chronic pain of left knee  Chronic pain of right knee  Muscle weakness (generalized)  Difficulty in walking, not elsewhere classified  Other low back pain  Rationale for Evaluation and Treatment Rehabilitation  ONSET DATE: 2020  SUBJECTIVE:   SUBJECTIVE STATEMENT: Pt reports primary c/o LBP with associated glute and BIL thigh/knee pain shortly after returning to work in 2020. She reports that recently, the pain is traveling down to her ankles. Pt reports she does not experience isolated knee pain, but that the knee pain is always associated with LBP. She reports that she feels better when she walks, although she fatigues easily due to the weight of her legs. Pt reports she has a membership to [employer] and has been working on her core and LE strength. 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 Pt denies any associated N/T related to this problem. She describes as burning, tight, radiating, sharp. Current pain is 7/10. Worst pain is 10/10. Best pain is 3/10. Aggravating factors include sitting> 30 minutes, ascending/descending stairs, getting out of a chair, static standing > 15 minutes. Easing factors include walking, pain medication, heating pad. Pt denies any unexplained weight change, changes in bowel/ bladder function, saddle anesthesia, nausea/ vomiting, or unrelenting night pain.  PERTINENT HISTORY: Asthma, hx of seizures (none since 2013), sleep apnea, anemia, anxiety, prediabetes  PAIN:  Are you having pain? Yes: NPRS scale: 7/10 Pain location: Lumbar spine, BIL glutes, BIL knees Pain  description: Burning, radiating, sharp, tight Aggravating factors: sitting> 30 minutes, ascending/descending stairs, getting out of a chair, static standing > 15 minutes Relieving factors: walking, pain medication, heating pad  PRECAUTIONS: None  WEIGHT BEARING RESTRICTIONS No  FALLS:  Has patient fallen in last 6 months? No  LIVING ENVIRONMENT: Lives with: lives with their family Lives in: House/apartment Stairs: No Has following equipment at home: None  OCCUPATION: Environmental consultant  PLOF: Independent  PATIENT GOALS: Move better, walk, workout, decrease pain at work   OBJECTIVE:   DIAGNOSTIC FINDINGS: None available  PATIENT SURVEYS:  FOTO 46%, predicted 60% in visits  COGNITION:  Overall cognitive status: Within functional limits for tasks assessed     SENSATION: Not tested   MUSCLE LENGTH: Thomas test: Moderately tight BIL  POSTURE: rounded shoulders, forward head, increased lumbar lordosis, and anterior pelvic tilt  PALPATION: TTP to BIL lumbar paraspinals/ QL, TTP to BIL to inferior and superior patellar poles  PASSIVE ACCESSORIES: Hypermobile and painful L1-L5 CPAs  LUMBAR ROM:   Active  AROM  06/28/2022  Flexion WNL  Extension WNL  Right lateral flexion WNL  Left lateral flexion WNL  Right rotation WNL  Left rotation WNL   (Blank rows = not tested)   LOWER EXTREMITY ROM:  Active ROM Right eval Left eval  Knee flexion 90/95 100/105p!  Knee extension -3/0 -3/0   (Blank rows = not tested)  LOWER EXTREMITY MMT:  MMT Right eval Left eval  Hip flexion 5/5 5/5  Hip extension 2+/5 2+/5  Hip abduction 3/5 3/5  Knee flexion 5/5 5/5  Knee extension 5/5 5/5  Ankle dorsiflexion 5/5 5/5  Ankle plantarflexion 5/5 5/5   (Blank rows = not tested)  SPECIAL TESTS:  Slump: (-) BIL SLR: (-) BIL PLE: (-) Patellar apprehension: (+) BIL Lateral patellar pull sign: (+) BIL Patellar compression: (+) BIL  FUNCTIONAL TESTS:  5xSTS: 25  seconds Squat: 50%, pain, Gower's sign Plank: Unable to perform   TODAY'S TREATMENT: 06/27/2022: Demonstrated and issued HEP   PATIENT EDUCATION:  Education details: Pt educated on probable underlying pathophysiology, FOTO, prognosis, POC, and HEP Person educated: Patient Education method: Explanation, Demonstration, and Handouts Education comprehension: verbalized understanding and returned demonstration   HOME EXERCISE PROGRAM: Access Code: [email or web address] URL: [email or web address] Date: 06/28/2022 Prepared by: Vanessa [place]  Exercises - Sidelying Hip Abduction  - 1 x daily - 7 x weekly - 3 sets - 10 reps - 3-sec hold - Supine 90/90 Abdominal Bracing *with handhold resistance to thighs*  - 1 x daily - 7 x weekly - 3 sets - 30-sec hold - Supine Bridge  - 1 x daily - 7 x weekly - 3 sets - 10 reps - 3-sec hold - Mini Squat with Counter Support  - 1 x daily - 7 x weekly - 3 sets -  10 reps - Modified Thomas Stretch  - 1 x daily - 7 x weekly - 2 sets - 1-min hold  ASSESSMENT:  CLINICAL IMPRESSION: Patient is a 51 y.o. F who was seen today for physical therapy evaluation and treatment for chronic LBP and BIL knee pain.  Upon assessment of primary impairments, pt's primary impairments include limited BIL knee flexion and extension AROM, painful and hypermobile lumbar passive accessory mobility, weak BIL hip extension and abduction MMT, weak functional core strength, and tight BIL hip flexors. Ruling up BIL PFPS due to positive special test cluster, along with report of pain with stairs. Also ruling up mechanical LBP with associated weak core musculature due to inability to perform a plank, hypermobile and painful lumbar passive accessories, and TTP to lumbar paraspinal musculature. Ruling down lumbar radiculopathy due to negative slump and SLR testing and no report of associated N/T or radicular weakness. Pt will benefit from skilled PT to address her primary impairments and  return to her prior level of function with less limitation.   OBJECTIVE IMPAIRMENTS Abnormal gait, decreased activity tolerance, decreased balance, decreased endurance, decreased mobility, difficulty walking, decreased ROM, decreased strength, hypomobility, increased edema, impaired flexibility, improper body mechanics, postural dysfunction, obesity, and pain.   ACTIVITY LIMITATIONS carrying, lifting, bending, sitting, standing, squatting, and stairs  PARTICIPATION LIMITATIONS: cleaning, laundry, driving, shopping, community activity, occupation, and yard work  PERSONAL FACTORS Time since onset of injury/illness/exacerbation, 3+ comorbidities: See medical hx, and Multiple treatment sites  are also affecting patient's functional outcome.   REHAB POTENTIAL: Good  CLINICAL DECISION MAKING: Evolving/moderate complexity  EVALUATION COMPLEXITY: Moderate   GOALS: Goals reviewed with patient? Yes  SHORT TERM GOALS: Target date: 07/25/2022  Pt will report understanding and adherence to initial HEP in order to promote independence in the management of primary impairments. Baseline: HEP provided at eval Goal status: INITIAL   LONG TERM GOALS: Target date: 08/22/2022   Pt will achieve a FOTO score of 60% in order to demonstrate improved functional ability as it relates to her knee pain. Baseline: 46% Goal status: INITIAL  2.  Pt will report ability to sit >1 hour with 0-3/10 pain in order to complete her job duties with less limitation. Baseline: >7/10 pain with >30 minutes of sitting Goal status: INITIAL  3.  Pt will achieve x10 full-depth squats with 0-3/10 pain in order to lift groceries from the floor with less limitation. Baseline: >7/10 pain and 50% limited depth squat Goal status: INITIAL  4.  Pt will achieve BIL global hip strength of 4+/5 in order to progress her independent LE strengthening regimen with less limitation. Baseline: See MMT chart Goal status:  INITIAL    PLAN: PT FREQUENCY: 1x/week  PT DURATION: 8 weeks  PLANNED INTERVENTIONS: Therapeutic exercises, Therapeutic activity, Neuromuscular re-education, Balance training, Gait training, Patient/Family education, Self Care, Joint mobilization, Joint manipulation, Stair training, Orthotic/Fit training, Aquatic Therapy, Dry Needling, Electrical stimulation, Spinal manipulation, Spinal mobilization, Cryotherapy, Moist heat, Taping, Traction, Biofeedback, Ionotophoresis 29m/ml Dexamethasone, Manual therapy, and Re-evaluation  PLAN FOR NEXT SESSION: Progress core/ hip strengthening, quad reinforcement in good alignment   YVanessa Mead PT, DPT 06/28/22 7:22 PM  PHYSICAL THERAPY DISCHARGE SUMMARY  Visits from Start of Care: 1  Current functional level related to goals / functional outcomes: Unable to assess   Remaining deficits: Unable to assess   Education / Equipment: HEP   Patient agrees to discharge. Patient goals were not met. Patient is being discharged due to not returning since the  last visit.  Vanessa Churchill, PT, DPT 08/09/22 6:02 PM

## 2022-06-29 ENCOUNTER — Ambulatory Visit: Payer: BC Managed Care – PPO | Admitting: Obstetrics and Gynecology

## 2022-06-29 NOTE — Progress Notes (Deleted)
[facility] Return Visit  SUBJECTIVE  History of Present Illness: Tracy Mcpherson is a 51 y.o. female seen in follow-up for overactive bladder. Plan at last visit was to start medication (gemtesa '75mg'$ ). Initially Myrbetriq was ordered but this was not covered by insurance. .   She feels the medication is helping with the urgency, she is able to make it to the bathroom without leakage. She only leaks twice throughout the workday. She is drinking more water now as well. She has also been exercising more.   Has physical therapy set up for March.    Past Medical History: Patient  has a past medical history of Anemia, Anxiety, Asthma, Constipation, Epilepsy ([place]), Fibroids, History of gestational diabetes, History of herpes simplex type 2 infection, History of pregnancy induced hypertension, leukocytosis, Iron deficiency anemia, Joint pain, Lower extremity edema, Migraines, Morbid obesity ([place]), Pain in both lower legs, Plantar fasciitis, Prediabetes, Right knee pain, Seasonal allergies, Seizures ([place]), Sleep apnea, Vitamin B 12 deficiency, and Vitamin D deficiency.   Past Surgical History: She  has a past surgical history that includes Cesarean section (11-02-2007); IUD removal (12-16-2009); REMOVAL THYROID CYST (2000); Gynecologic cryosurgery; and Craniotomy for temporal lobectomy.   Medications: She has a current medication list which includes the following prescription(s): albuterol, ashwagandha, azelastine hcl, diclofenac, fluocinonide ointment, fusion plus, ketotifen, multivitamin with minerals, triamcinolone ointment, and vitamin d (ergocalciferol).   Allergies: Patient is allergic to flexeril [cyclobenzaprine hcl], lamictal [lamotrigine], morphine and related, percocet [oxycodone-acetaminophen], doxycycline, topiramate, and vimpat [lacosamide].   Social History: Patient  reports that she has never smoked. She has never used smokeless tobacco. She reports that she does not  drink alcohol and does not use drugs.      OBJECTIVE     Physical Exam: There were no vitals filed for this visit.  Gen: No apparent distress, A&O x 3.  Detailed Urogynecologic Evaluation:  Deferred.    ASSESSMENT AND PLAN    Ms. Early is a 51 y.o. with:  No diagnosis found.  - She will continue with the Gemtesa '75mg'$  - start physical therapy  Follow up in 6 months or sooner if needed  Jaquita Folds, MD  Time spent: I spent 20 minutes dedicated to the care of this patient on the date of this encounter to include pre-visit review of records, face-to-face time with the patient and post visit documentation.

## 2022-07-05 DIAGNOSIS — F324 Major depressive disorder, single episode, in partial remission: Secondary | ICD-10-CM | POA: Diagnosis not present

## 2022-07-10 ENCOUNTER — Ambulatory Visit: Payer: Federal, State, Local not specified - PPO | Attending: Family Medicine | Admitting: Physical Therapy

## 2022-07-10 NOTE — Therapy (Deleted)
OUTPATIENT PHYSICAL THERAPY TREATMENT NOTE   Patient Name: MENNA ABELN MRN: [id number] DOB:[birth year], 51 y.o., female Today's Date: 07/10/2022  PCP: Jonathon Jordan, MD   REFERRING PROVIDER: Rosemarie Ax, MD   Past Medical History:  Diagnosis Date   Anemia    Anxiety    Asthma    Constipation    Epilepsy ([place])    Fibroids    History of gestational diabetes    History of herpes simplex type 2 infection    History of pregnancy induced hypertension    Hx of leukocytosis    mild   Iron deficiency anemia    Joint pain    Lower extremity edema    Migraines    Morbid obesity ([place])    Pain in both lower legs    Plantar fasciitis    Prediabetes    Right knee pain    Seasonal allergies    Seizures ([place])    Sleep apnea    Vitamin B 12 deficiency    Vitamin D deficiency    Past Surgical History:  Procedure Laterality Date   CESAREAN SECTION  11-02-2007   CRANIOTOMY FOR TEMPORAL LOBECTOMY     GYNECOLOGIC CRYOSURGERY     IUD REMOVAL  12-16-2009   REMOVAL THYROID CYST  2000   Patient Active Problem List   Diagnosis Date Noted   Alopecia 05/31/2022   Postmenopausal bleeding 05/31/2022   Asthma without status asthmaticus 05/31/2022   Gastroesophageal reflux disease 05/31/2022   Common migraine 05/31/2022   Sacroiliac joint dysfunction of right side 11/10/2021   Plantar fasciitis, bilateral 11/10/2021   Patellofemoral pain syndrome of both knees 08/21/2021   Vitamin D deficiency 05/12/2020   Prediabetes 05/12/2020   Class 3 severe obesity with serious comorbidity and body mass index (BMI) of 50.0 to 59.9 in adult [facility]) 05/12/2020   Unspecified deficiency anemia 02/20/2012   Seizure disorder ([place]) 02/20/2012   Leucocytosis 02/20/2012    THERAPY DIAG:  No diagnosis found.  REFERRING DIAG: M22.2X1,M22.2X2 (ICD-10-CM) - Patellofemoral pain syndrome of both knees  PERTINENT HISTORY: Asthma, hx of seizures (none since 2013), sleep apnea, anemia, anxiety,  prediabetes    PRECAUTIONS/RESTRICTIONS:   none  SUBJECTIVE:  ***  PAIN:  Are you having pain? Yes: NPRS scale: 7/10 Pain location: Lumbar spine, BIL glutes, BIL knees Pain description: Burning, radiating, sharp, tight Aggravating factors: sitting> 30 minutes, ascending/descending stairs, getting out of a chair, static standing > 15 minutes Relieving factors: walking, pain medication, heating pad  OBJECTIVE: (objective measures completed at initial evaluation unless otherwise dated)  DIAGNOSTIC FINDINGS: None available   PATIENT SURVEYS:  FOTO 46%, predicted 60% in visits   COGNITION:           Overall cognitive status: Within functional limits for tasks assessed                          SENSATION: Not tested     MUSCLE LENGTH: Thomas test: Moderately tight BIL   POSTURE: rounded shoulders, forward head, increased lumbar lordosis, and anterior pelvic tilt   PALPATION: TTP to BIL lumbar paraspinals/ QL, TTP to BIL to inferior and superior patellar poles   PASSIVE ACCESSORIES: Hypermobile and painful L1-L5 CPAs   LUMBAR ROM:    Active  AROM  06/28/2022  Flexion WNL  Extension WNL  Right lateral flexion WNL  Left lateral flexion WNL  Right rotation WNL  Left rotation WNL   (Blank rows = not tested)  LOWER EXTREMITY ROM:   Active ROM Right eval Left eval  Knee flexion 90/95 100/105p!  Knee extension -3/0 -3/0   (Blank rows = not tested)   LOWER EXTREMITY MMT:   MMT Right eval Left eval  Hip flexion 5/5 5/5  Hip extension 2+/5 2+/5  Hip abduction 3/5 3/5  Knee flexion 5/5 5/5  Knee extension 5/5 5/5  Ankle dorsiflexion 5/5 5/5  Ankle plantarflexion 5/5 5/5   (Blank rows = not tested)   SPECIAL TESTS:  Slump: (-) BIL SLR: (-) BIL PLE: (-) Patellar apprehension: (+) BIL Lateral patellar pull sign: (+) BIL Patellar compression: (+) BIL   FUNCTIONAL TESTS:  5xSTS: 25 seconds Squat: 50%, pain, Gower's sign Plank: Unable to perform      TODAY'S TREATMENT: 06/27/2022: Demonstrated and issued HEP     PATIENT EDUCATION:  Education details: Pt educated on probable underlying pathophysiology, FOTO, prognosis, POC, and HEP Person educated: Patient Education method: Explanation, Demonstration, and Handouts Education comprehension: verbalized understanding and returned demonstration     HOME EXERCISE PROGRAM: Access Code: [email or web address] URL: [email or web address] Date: 06/28/2022 Prepared by: Vanessa [place]   Exercises - Sidelying Hip Abduction  - 1 x daily - 7 x weekly - 3 sets - 10 reps - 3-sec hold - Supine 90/90 Abdominal Bracing *with handhold resistance to thighs*  - 1 x daily - 7 x weekly - 3 sets - 30-sec hold - Supine Bridge  - 1 x daily - 7 x weekly - 3 sets - 10 reps - 3-sec hold - Mini Squat with Counter Support  - 1 x daily - 7 x weekly - 3 sets - 10 reps - Modified Thomas Stretch  - 1 x daily - 7 x weekly - 2 sets - 1-min hold   TREATMENT 9/5:  Therapeutic Exercise: - ***  Manual Therapy: - ***  Neuromuscular re-ed: - ***  Therapeutic Activity: - ***  Self-care/Home Management: - ***  ASSESSMENT:   CLINICAL IMPRESSION: Patient is a 51 y.o. F who was seen today for physical therapy evaluation and treatment for chronic LBP and BIL knee pain.  Upon assessment of primary impairments, pt's primary impairments include limited BIL knee flexion and extension AROM, painful and hypermobile lumbar passive accessory mobility, weak BIL hip extension and abduction MMT, weak functional core strength, and tight BIL hip flexors. Ruling up BIL PFPS due to positive special test cluster, along with report of pain with stairs. Also ruling up mechanical LBP with associated weak core musculature due to inability to perform a plank, hypermobile and painful lumbar passive accessories, and TTP to lumbar paraspinal musculature. Ruling down lumbar radiculopathy due to negative slump and SLR testing and no report  of associated N/T or radicular weakness. Pt will benefit from skilled PT to address her primary impairments and return to her prior level of function with less limitation.     OBJECTIVE IMPAIRMENTS Abnormal gait, decreased activity tolerance, decreased balance, decreased endurance, decreased mobility, difficulty walking, decreased ROM, decreased strength, hypomobility, increased edema, impaired flexibility, improper body mechanics, postural dysfunction, obesity, and pain.    ACTIVITY LIMITATIONS carrying, lifting, bending, sitting, standing, squatting, and stairs   PARTICIPATION LIMITATIONS: cleaning, laundry, driving, shopping, community activity, occupation, and yard work   PERSONAL FACTORS Time since onset of injury/illness/exacerbation, 3+ comorbidities: See medical hx, and Multiple treatment sites  are also affecting patient's functional outcome.    REHAB POTENTIAL: Good   CLINICAL DECISION MAKING: Evolving/moderate complexity   EVALUATION COMPLEXITY: Moderate  GOALS: Goals reviewed with patient? Yes   SHORT TERM GOALS: Target date: 07/25/2022  Pt will report understanding and adherence to initial HEP in order to promote independence in the management of primary impairments. Baseline: HEP provided at eval Goal status: INITIAL     LONG TERM GOALS: Target date: 08/22/2022    Pt will achieve a FOTO score of 60% in order to demonstrate improved functional ability as it relates to her knee pain. Baseline: 46% Goal status: INITIAL   2.  Pt will report ability to sit >1 hour with 0-3/10 pain in order to complete her job duties with less limitation. Baseline: >7/10 pain with >30 minutes of sitting Goal status: INITIAL   3.  Pt will achieve x10 full-depth squats with 0-3/10 pain in order to lift groceries from the floor with less limitation. Baseline: >7/10 pain and 50% limited depth squat Goal status: INITIAL   4.  Pt will achieve BIL global hip strength of 4+/5 in order to  progress her independent LE strengthening regimen with less limitation. Baseline: See MMT chart Goal status: INITIAL       PLAN: PT FREQUENCY: 1x/week   PT DURATION: 8 weeks   PLANNED INTERVENTIONS: Therapeutic exercises, Therapeutic activity, Neuromuscular re-education, Balance training, Gait training, Patient/Family education, Self Care, Joint mobilization, Joint manipulation, Stair training, Orthotic/Fit training, Aquatic Therapy, Dry Needling, Electrical stimulation, Spinal manipulation, Spinal mobilization, Cryotherapy, Moist heat, Taping, Traction, Biofeedback, Ionotophoresis '4mg'$ /ml Dexamethasone, Manual therapy, and Re-evaluation   PLAN FOR NEXT SESSION: Progress core/ hip strengthening, quad reinforcement in good alignment   Aarian Cleaver E Joslyne Marshburn PT 07/10/2022, 1:50 PM

## 2022-07-13 DIAGNOSIS — J019 Acute sinusitis, unspecified: Secondary | ICD-10-CM | POA: Diagnosis not present

## 2022-07-13 DIAGNOSIS — R0981 Nasal congestion: Secondary | ICD-10-CM | POA: Diagnosis not present

## 2022-07-17 ENCOUNTER — Telehealth: Payer: Self-pay

## 2022-07-17 ENCOUNTER — Ambulatory Visit: Payer: Federal, State, Local not specified - PPO

## 2022-07-17 NOTE — Telephone Encounter (Signed)
Left message for pt regarding 2nd no show. Informed pt of future appointments being cancelled. Also detailed the clinic attendance policy and provided clinic phone number in case pt would like to schedule another appointment.

## 2022-07-24 ENCOUNTER — Encounter: Payer: Federal, State, Local not specified - PPO | Admitting: Physical Therapy

## 2022-08-01 ENCOUNTER — Ambulatory Visit: Payer: Federal, State, Local not specified - PPO | Admitting: Family Medicine

## 2022-08-03 DIAGNOSIS — L658 Other specified nonscarring hair loss: Secondary | ICD-10-CM | POA: Diagnosis not present

## 2022-08-03 DIAGNOSIS — L089 Local infection of the skin and subcutaneous tissue, unspecified: Secondary | ICD-10-CM | POA: Diagnosis not present

## 2022-08-03 DIAGNOSIS — L669 Cicatricial alopecia, unspecified: Secondary | ICD-10-CM | POA: Diagnosis not present

## 2022-08-07 ENCOUNTER — Encounter: Payer: Self-pay | Admitting: Hematology and Oncology

## 2022-08-31 DIAGNOSIS — N915 Oligomenorrhea, unspecified: Secondary | ICD-10-CM | POA: Diagnosis not present

## 2022-08-31 DIAGNOSIS — N393 Stress incontinence (female) (male): Secondary | ICD-10-CM | POA: Diagnosis not present

## 2022-08-31 DIAGNOSIS — Z01419 Encounter for gynecological examination (general) (routine) without abnormal findings: Secondary | ICD-10-CM | POA: Diagnosis not present

## 2022-08-31 DIAGNOSIS — Z0000 Encounter for general adult medical examination without abnormal findings: Secondary | ICD-10-CM | POA: Diagnosis not present

## 2022-08-31 DIAGNOSIS — Z124 Encounter for screening for malignant neoplasm of cervix: Secondary | ICD-10-CM | POA: Diagnosis not present

## 2022-08-31 DIAGNOSIS — N920 Excessive and frequent menstruation with regular cycle: Secondary | ICD-10-CM | POA: Diagnosis not present

## 2022-09-11 DIAGNOSIS — M79661 Pain in right lower leg: Secondary | ICD-10-CM | POA: Diagnosis not present

## 2022-09-11 DIAGNOSIS — M79605 Pain in left leg: Secondary | ICD-10-CM | POA: Diagnosis not present

## 2022-09-11 DIAGNOSIS — M79604 Pain in right leg: Secondary | ICD-10-CM | POA: Diagnosis not present

## 2022-09-11 DIAGNOSIS — M79662 Pain in left lower leg: Secondary | ICD-10-CM | POA: Diagnosis not present

## 2022-09-19 ENCOUNTER — Ambulatory Visit: Payer: BC Managed Care – PPO | Admitting: Orthopaedic Surgery

## 2022-09-24 DIAGNOSIS — L089 Local infection of the skin and subcutaneous tissue, unspecified: Secondary | ICD-10-CM | POA: Diagnosis not present

## 2022-09-24 DIAGNOSIS — Z79899 Other long term (current) drug therapy: Secondary | ICD-10-CM | POA: Diagnosis not present

## 2022-09-24 DIAGNOSIS — L658 Other specified nonscarring hair loss: Secondary | ICD-10-CM | POA: Diagnosis not present

## 2022-09-24 DIAGNOSIS — L668 Other cicatricial alopecia: Secondary | ICD-10-CM | POA: Diagnosis not present

## 2022-10-23 DIAGNOSIS — M79604 Pain in right leg: Secondary | ICD-10-CM | POA: Diagnosis not present

## 2022-10-23 DIAGNOSIS — M79605 Pain in left leg: Secondary | ICD-10-CM | POA: Diagnosis not present

## 2022-10-23 DIAGNOSIS — R600 Localized edema: Secondary | ICD-10-CM | POA: Diagnosis not present

## 2022-10-23 DIAGNOSIS — I890 Lymphedema, not elsewhere classified: Secondary | ICD-10-CM | POA: Diagnosis not present

## 2022-11-01 DIAGNOSIS — M25562 Pain in left knee: Secondary | ICD-10-CM | POA: Diagnosis not present

## 2022-11-01 DIAGNOSIS — M25561 Pain in right knee: Secondary | ICD-10-CM | POA: Diagnosis not present

## 2022-11-09 ENCOUNTER — Telehealth: Payer: Self-pay | Admitting: Orthopaedic Surgery

## 2022-11-09 ENCOUNTER — Encounter: Payer: Self-pay | Admitting: Hematology and Oncology

## 2022-11-09 ENCOUNTER — Ambulatory Visit (INDEPENDENT_AMBULATORY_CARE_PROVIDER_SITE_OTHER): Payer: BC Managed Care – PPO | Admitting: Orthopaedic Surgery

## 2022-11-09 ENCOUNTER — Ambulatory Visit (INDEPENDENT_AMBULATORY_CARE_PROVIDER_SITE_OTHER): Payer: BC Managed Care – PPO

## 2022-11-09 DIAGNOSIS — G8929 Other chronic pain: Secondary | ICD-10-CM

## 2022-11-09 DIAGNOSIS — M25561 Pain in right knee: Secondary | ICD-10-CM

## 2022-11-09 DIAGNOSIS — M25562 Pain in left knee: Secondary | ICD-10-CM | POA: Diagnosis not present

## 2022-11-09 MED ORDER — TRIAMCINOLONE ACETONIDE 40 MG/ML IJ SUSP
80.0000 mg | INTRAMUSCULAR | Status: AC | PRN
  Administered 2022-11-09: 80 mg via INTRA_ARTICULAR

## 2022-11-09 MED ORDER — LIDOCAINE HCL 1 % IJ SOLN
4.0000 mL | INTRAMUSCULAR | Status: AC | PRN
  Administered 2022-11-09: 4 mL

## 2022-11-09 NOTE — Telephone Encounter (Signed)
Patient requesting PT referral at the [place] location please advise

## 2022-11-09 NOTE — Progress Notes (Signed)
Chief Complaint: Knee pain     History of Present Illness:    Tracy Mcpherson is a 52 y.o. female presents today with bilateral knee pain right worse than left.  This is been really over the last several years although it has been getting quite painful and tight since October.  There is pain predominantly medially.  The right is worse than the left.  She has difficulty time walking and going to [place].  She notes that this has become increasingly painful since she began a sedentary job.  She is experiencing popping.  She does feel like the knee is giving out as well.    Surgical History:   None  PMH/PSH/Family History/Social History/Meds/Allergies:    Past Medical History:  Diagnosis Date  . Anemia   . Anxiety   . Asthma   . Constipation   . Epilepsy ([place])   . Fibroids   . History of gestational diabetes   . History of herpes simplex type 2 infection   . History of pregnancy induced hypertension   . Hx of leukocytosis    mild  . Iron deficiency anemia   . Joint pain   . Lower extremity edema   . Migraines   . Morbid obesity ([place])   . Pain in both lower legs   . Plantar fasciitis   . Prediabetes   . Right knee pain   . Seasonal allergies   . Seizures ([place])   . Sleep apnea   . Vitamin B 12 deficiency   . Vitamin D deficiency    Past Surgical History:  Procedure Laterality Date  . CESAREAN SECTION  11-02-2007  . CRANIOTOMY FOR TEMPORAL LOBECTOMY    . GYNECOLOGIC CRYOSURGERY    . IUD REMOVAL  12-16-2009  . REMOVAL THYROID CYST  2000   Social History   Socioeconomic History  . Marital status: Legally Separated    Spouse name: Not on file  . Number of children: 1  . Years of education: Not on file  . Highest education level: Not on file  Occupational History  . Occupation: Tax adviser  Tobacco Use  . Smoking status: Never  . Smokeless tobacco: Never  Vaping Use  . Vaping Use: Never used  Substance and Sexual  Activity  . Alcohol use: No  . Drug use: No  . Sexual activity: Not Currently    Birth control/protection: None  Other Topics Concern  . Not on file  Social History Narrative  . Not on file   Social Determinants of Health   Financial Resource Strain: Not on file  Food Insecurity: No Food Insecurity (04/20/2022)   Hunger Vital Sign   . Worried About Charity fundraiser in the Last Year: Never true   . Ran Out of Food in the Last Year: Never true  Transportation Needs: No Transportation Needs (04/20/2022)   PRAPARE - Transportation   . Lack of Transportation (Medical): No   . Lack of Transportation (Non-Medical): No  Physical Activity: Not on file  Stress: Not on file  Social Connections: Not on file   Family History  Problem Relation Age of Onset  . Hypertension Mother   . Obesity Mother   . Fibroids Mother   . Hypertension Sister   . Cancer Maternal Uncle  lung  . Cancer Maternal Grandmother        lung  . Kidney disease Maternal Grandfather   . Alzheimer's disease Paternal Grandmother    Allergies  Allergen Reactions  . Flexeril [Cyclobenzaprine Hcl] Hives  . Lamictal [Lamotrigine] Other (See Comments)    Muscles  Tightened.  . Morphine And Related Hives  . Percocet [Oxycodone-Acetaminophen] Hives  . Doxycycline Hives  . Topiramate Other (See Comments)    Affecting her thinking  . Vimpat [Lacosamide]     Muscle tightness    Current Outpatient Medications  Medication Sig Dispense Refill  . albuterol (VENTOLIN HFA) 108 (90 Base) MCG/ACT inhaler Inhale into the lungs.    . ASHWAGANDHA PO Take by mouth daily.    . Azelastine HCl 137 MCG/SPRAY SOLN Place 1 spray into both nostrils 2 (two) times daily.    . diclofenac (VOLTAREN) 75 MG EC tablet Take 1 tablet (75 mg total) by mouth 2 (two) times daily as needed. 60 tablet 3  . fluocinonide ointment (LIDEX) 0.05 % Apply topically.    . Iron-FA-B Cmp-C-Biot-Probiotic (FUSION PLUS) CAPS Take 1 capsule by mouth  daily.    Marland Kitchen ketotifen (ZADITOR) 0.025 % ophthalmic solution 1 drop as needed.    . Multiple Vitamin (MULTIVITAMIN WITH MINERALS) TABS tablet Take 1 tablet by mouth daily.    Marland Kitchen triamcinolone ointment (KENALOG) 0.1 % SMARTSIG:Topical 4 Times a Week    . Vitamin D, Ergocalciferol, (DRISDOL) 1.25 MG (50000 UNIT) CAPS capsule Take 1 capsule (50,000 Units total) by mouth every 3 (three) days. (Patient taking differently: Take 50,000 Units by mouth once a week.) 10 capsule 0   No current facility-administered medications for this visit.   No results found.  Review of Systems:   A ROS was performed including pertinent positives and negatives as documented in the HPI.  Physical Exam :   Constitutional: NAD and appears stated age Neurological: Alert and oriented Psych: Appropriate affect and cooperative Last menstrual period 10/13/2019.   Comprehensive Musculoskeletal Exam:    Tenderness palpation about the medial lateral joint lines on the right and left.  There is significant crepitus.  Range of motion is from 0 to 120 degrees on both sides.  Imaging:   Xray (4 views right knee, 4 views left knee): There is significant medial joint space narrowing of both although left worse than right with regard to the tibiofemoral joint spaces    I personally reviewed and interpreted the radiographs.   Assessment:   52 y.o. 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  At this time given the fact that is been quite a significant time since she has had injections I recommended ultrasound injections of both knees.  Will plan to proceed with this.  I will plan to see her back as needed  Plan :    -Bilateral ultrasound-guided injections of the left and right knee performed with verbal consent obtained    Procedure Note  Patient: Tracy Mcpherson             Date of Birth: 1971-03-14           MRN: [id number]             Visit Date: 11/09/2022  Procedures: Visit  Diagnoses:  1. Chronic pain of right knee   2. Chronic pain of left knee     Large Joint Inj: R knee on 11/09/2022 11:18 AM Indications: pain Details: 22 G 1.5 in needle, ultrasound-guided anterior  approach  Arthrogram: No  Medications: 4 mL lidocaine 1 %; 80 mg triamcinolone acetonide 40 MG/ML Outcome: tolerated well, no immediate complications Procedure, treatment alternatives, risks and benefits explained, specific risks discussed. Consent was given by the patient. Immediately prior to procedure a time out was called to verify the correct patient, procedure, equipment, support staff and site/side marked as required. Patient was prepped and draped in the usual sterile fashion.    Large Joint Inj: L knee on 11/09/2022 11:19 AM Indications: pain Details: 22 G 1.5 in needle, ultrasound-guided anterior approach  Arthrogram: No  Medications: 4 mL lidocaine 1 %; 80 mg triamcinolone acetonide 40 MG/ML Outcome: tolerated well, no immediate complications Procedure, treatment alternatives, risks and benefits explained, specific risks discussed. Consent was given by the patient. Immediately prior to procedure a time out was called to verify the correct patient, procedure, equipment, support staff and site/side marked as required. Patient was prepped and draped in the usual sterile fashion.          I personally saw and evaluated the patient, and participated in the management and treatment plan.  Vanetta Mulders, MD Attending Physician, Orthopedic Surgery  This document was dictated using Dragon voice recognition software. A reasonable attempt at proof reading has been made to minimize errors.

## 2022-11-12 ENCOUNTER — Other Ambulatory Visit (HOSPITAL_BASED_OUTPATIENT_CLINIC_OR_DEPARTMENT_OTHER): Payer: Self-pay | Admitting: Orthopaedic Surgery

## 2022-11-12 DIAGNOSIS — G8929 Other chronic pain: Secondary | ICD-10-CM

## 2022-11-12 NOTE — Telephone Encounter (Signed)
Referral placed in chart  

## 2022-11-15 ENCOUNTER — Encounter: Payer: Self-pay | Admitting: Hematology and Oncology

## 2022-11-23 ENCOUNTER — Encounter (HOSPITAL_BASED_OUTPATIENT_CLINIC_OR_DEPARTMENT_OTHER): Payer: Self-pay | Admitting: Physical Therapy

## 2022-11-23 ENCOUNTER — Other Ambulatory Visit: Payer: Self-pay

## 2022-11-23 ENCOUNTER — Ambulatory Visit (HOSPITAL_BASED_OUTPATIENT_CLINIC_OR_DEPARTMENT_OTHER): Payer: BC Managed Care – PPO | Attending: Orthopaedic Surgery | Admitting: Physical Therapy

## 2022-11-23 DIAGNOSIS — M25561 Pain in right knee: Secondary | ICD-10-CM | POA: Insufficient documentation

## 2022-11-23 DIAGNOSIS — M25562 Pain in left knee: Secondary | ICD-10-CM | POA: Insufficient documentation

## 2022-11-23 DIAGNOSIS — M6281 Muscle weakness (generalized): Secondary | ICD-10-CM | POA: Diagnosis not present

## 2022-11-23 DIAGNOSIS — R262 Difficulty in walking, not elsewhere classified: Secondary | ICD-10-CM | POA: Diagnosis not present

## 2022-11-23 DIAGNOSIS — G8929 Other chronic pain: Secondary | ICD-10-CM | POA: Insufficient documentation

## 2022-11-23 NOTE — Therapy (Signed)
OUTPATIENT PHYSICAL THERAPY LOWER EXTREMITY EVALUATION   Patient Name: Tracy Mcpherson MRN: [id number] DOB:1971/08/07, 52 y.o., female Today's Date: 11/23/2022  END OF SESSION:  PT End of Session - 11/23/22 0941     Visit Number 1    Number of Visits 16    Date for PT Re-Evaluation 02/21/23    Authorization Type [employer]    PT Start Time [phone]    PT Stop Time 0935    PT Time Calculation (min) 46 min    Activity Tolerance Patient tolerated treatment well;Patient limited by pain    Behavior During Therapy WFL for tasks assessed/performed             Past Medical History:  Diagnosis Date   Anemia    Anxiety    Asthma    Constipation    Epilepsy ([place])    Fibroids    History of gestational diabetes    History of herpes simplex type 2 infection    History of pregnancy induced hypertension    Hx of leukocytosis    mild   Iron deficiency anemia    Joint pain    Lower extremity edema    Migraines    Morbid obesity ([facility])    Pain in both lower legs    Plantar fasciitis    Prediabetes    Right knee pain    Seasonal allergies    Seizures ([place])    Sleep apnea    Vitamin B 12 deficiency    Vitamin D deficiency    Past Surgical History:  Procedure Laterality Date   CESAREAN SECTION  11-02-2007   CRANIOTOMY FOR TEMPORAL LOBECTOMY     GYNECOLOGIC CRYOSURGERY     IUD REMOVAL  12-16-2009   REMOVAL THYROID CYST  2000   Patient Active Problem List   Diagnosis Date Noted   Alopecia 05/31/2022   Postmenopausal bleeding 05/31/2022   Asthma without status asthmaticus 05/31/2022   Gastroesophageal reflux disease 05/31/2022   Common migraine 05/31/2022   Sacroiliac joint dysfunction of right side 11/10/2021   Plantar fasciitis, bilateral 11/10/2021   Patellofemoral pain syndrome of both knees 08/21/2021   Vitamin D deficiency 05/12/2020   Prediabetes 05/12/2020   Class 3 severe obesity with serious comorbidity and body mass index (BMI) of 50.0 to 59.9 in adult [facility])  05/12/2020   Unspecified deficiency anemia 02/20/2012   Seizure disorder ([place]) 02/20/2012   Leucocytosis 02/20/2012    PCP: Jonathon Jordan, MD   REFERRING PROVIDER:   Vanetta Mulders, MD    REFERRING DIAG:  Diagnosis  M25.561,G89.29 (ICD-10-CM) - Chronic pain of right knee  M25.562,G89.29 (ICD-10-CM) - Chronic pain of left knee    THERAPY DIAG:  Chronic pain of left knee - Plan: PT plan of care cert/re-cert  Chronic pain of right knee - Plan: PT plan of care cert/re-cert  Muscle weakness (generalized) - Plan: PT plan of care cert/re-cert  Difficulty in walking, not elsewhere classified - Plan: PT plan of care cert/re-cert  Rationale for Evaluation and Treatment: Rehabilitation  ONSET DATE: 10/23  SUBJECTIVE:   SUBJECTIVE STATEMENT: Pt states the knees starting hurt really badly during the holiday season. She started getting burning into the R knee and then into the L knee. She notes that this has become increasingly painful since she began a sedentary job. She is experiencing popping. She does feel like the knee is giving out as well. Pt states that on NYE she could not walk and heard cracking/popping with extreme pain. Pt  has received injections into bilat knees, lasted for for a short time. Pt is extremely stiff in the morning. Pt will have occasionally buckling. Pt was unable to go on her vacation due to her knee pain and fear of not being able to stand up from the sand.   Pt does do leg press, sit ups, and walking on the TM. She is a member of [employer]- just started there. Walks at [employer] daily. She walks 30 mins total there.   Pt denies cancer red flags.   PERTINENT HISTORY: Asthma, hx of seizures (none since 2013), sleep apnea, anemia, anxiety, prediabetes   PAIN:  Are you having pain? Yes: NPRS scale: 2/10 Pain location: R knee medially; L knee lateral and posteriorly Pain description: Burning, sharp, tight Aggravating factors: sitting> 30 minutes,  ascending/descending stairs, getting out of a chair, standing > 15 minutes Relieving factors:  pain medication, heating pad, movement/exercise    PRECAUTIONS: None   WEIGHT BEARING RESTRICTIONS No   FALLS:  Has patient fallen in last 6 months? No   LIVING ENVIRONMENT: Lives with: lives with their family Lives in: House/apartment Stairs: No Has following equipment at home: None   OCCUPATION: Environmental consultant; does travel for work    PLOF: Independent   PATIENT GOALS: able to do things with her son; be able to go on vacation     OBJECTIVE:   DIAGNOSTIC FINDINGS:  R knee x ray  IMPRESSION: Moderate tricompartmental osteoarthritis, most prominent in the medial tibiofemoral compartment.  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 Small joint effusion.    PATIENT SURVEYS:  FOTO 23  47 @ DC  19 pts MCII   COGNITION: Overall cognitive status: Within functional limits for tasks assessed     SENSATION: WFL   MUSCLE LENGTH: Positive Ely's Positive HS 90/90  POSTURE: No Significant postural limitations  PALPATION: TTP bilat knee joint lines; medial on R; more lateral pain on L; patellar tendon sensitivity  LOWER EXTREMITY ROM: moderately limited at bilateral knees due to joint stiffness, pain, and body habitus; hip ROM deferred today   LOWER EXTREMITY MMT:  MMT Right eval Left eval  Hip flexion    Hip extension    Hip abduction 32.9 33.2  Hip adduction    Hip internal rotation    Hip external rotation    Knee flexion 34.3 34.3  Knee extension 43.0 44.0   (Blank rows = not tested)   FUNCTIONAL TESTS:  5 times sit to stand: 17.5s   GAIT: Distance walked: 234f Assistive device utilized: None Level of assistance: Complete Independence Comments: bilat Trendelenberg; progressively worsens with duration of walking   TODAY'S TREATMENT:                                                                                                                               DATE: 1/19   Exercises ([place]) - Sitting  Knee Extension with Resistance  - 1 x daily - 7 x weekly - 3 sets - 10 reps - Seated Hamstring Curl with Anchored Resistance  - 1 x daily - 7 x weekly - 3 sets - 10 reps - Sit to Stand with Arms Crossed  - 1 x daily - 7 x weekly - 4 sets - 5 reps    PATIENT EDUCATION:  Education details: MOI, diagnosis, prognosis, anatomy, exercise progression, DOMS expectations, muscle firing,  envelope of function, HEP, POC  Person educated: Patient Education method: Explanation, Demonstration, Tactile cues, Verbal cues, and Handouts Education comprehension: verbalized understanding, returned demonstration, verbal cues required, and tactile cues required  HOME EXERCISE PROGRAM:  Access Code: [email or web address] URL: [email or web address] Date: 11/23/2022 Prepared by: Daleen Bo  ASSESSMENT:  CLINICAL IMPRESSION: Patient is a 52 y.o. female who was seen today for physical therapy evaluation and treatment for c/c of bilateral knee pain. Pt's s/s appear consistent with progressive knee OA and lack of supportive LE strength. Pt's pain is highly sensitive and irritable with increasing pain with static loading. Pt likely to benefit from aquatic environment due to current mobility limitations and body habitus. Pt is a member of a gym and would like to set up an exercise program for independent management of pain and progression of functional capacity. Pt would benefit from continued skilled therapy in order to reach goals and maximize functional bilat LE strength and ROM for prevention of further functional decline.   OBJECTIVE IMPAIRMENTS Abnormal gait, decreased activity tolerance, decreased balance, decreased endurance, decreased mobility, difficulty walking, decreased ROM, decreased strength, hypomobility, increased edema, impaired flexibility, improper body mechanics, postural  dysfunction, obesity, and pain.    ACTIVITY LIMITATIONS carrying, lifting, bending, sitting, standing, squatting, and stairs   PARTICIPATION LIMITATIONS: cleaning, laundry, driving, shopping, community activity, occupation, and yard work   PERSONAL FACTORS Time since onset of injury/illness/exacerbation, 3+ comorbidities: are also affecting patient's functional outcome.    REHAB POTENTIAL: Good   CLINICAL DECISION MAKING: Low/stable   EVALUATION COMPLEXITY: low     GOALS: SHORT TERM GOALS: Target date: 12/31/2022   Pt will become independent with HEP in order to demonstrate synthesis of PT education.   Goal status: INITIAL   2.  Pt will score >/= 19 on FOTO to demonstrate improvement in perceived L LE function.    Goal status: INITIAL   3.  Pt will report at least 2 pt reduction on NPRS scale for pain during ADL in order to demonstrate functional improvement with household activity, self care, and ADL.    Goal status: INITIAL     LONG TERM GOALS: Target date: 02/11/2023     Pt  will become independent with final HEP in order to demonstrate synthesis of PT education.   Goal status: INITIAL   2.  Pt will be able to demonstrate ascending and descending stairs with reciprocal pattern in order to demonstrate functional improvement in LE function for self-care and house hold duties.     Goal status: INITIAL   3.  Pt will score >/= 47 on FOTO to demonstrate improvement in perceived bilat Knee function.    Goal status: INITIAL   4.  Pt will be able to demonstrate/report ability to walk > 50 mins  in order to demonstrate functional improvement and tolerance to exercise and community mobility.   Goal status: INITIAL         PLAN: PT FREQUENCY: 1-2x/week   PT DURATION: 12weeks (likely to D/C in 10)  PLANNED INTERVENTIONS: Therapeutic exercises, Therapeutic activity, Neuromuscular re-education, Balance training, Gait training, Patient/Family education, Self Care, Joint  mobilization, Joint manipulation, Stair training, Orthotic/Fit training, Aquatic Therapy, Dry Needling, Electrical stimulation, Spinal manipulation, Spinal mobilization, Cryotherapy, Moist heat, Taping, Traction, Biofeedback, Ionotophoresis '4mg'$ /ml Dexamethasone, Manual therapy, and Re-evaluation   PLAN FOR NEXT SESSION: aquatic session: quad, HS, and hip strength, bilat knee flexibility, general conditioning    Daleen Bo, PT 11/23/2022, 9:54 AM

## 2022-11-27 DIAGNOSIS — R600 Localized edema: Secondary | ICD-10-CM | POA: Diagnosis not present

## 2022-12-11 NOTE — Therapy (Incomplete)
OUTPATIENT PHYSICAL THERAPY LOWER EXTREMITY EVALUATION   Patient Name: Tracy Mcpherson MRN: [id number] DOB:[birth year], 52 y.o., female Today's Date: 12/11/2022  END OF SESSION:    Past Medical History:  Diagnosis Date   Anemia    Anxiety    Asthma    Constipation    Epilepsy ([place])    Fibroids    History of gestational diabetes    History of herpes simplex type 2 infection    History of pregnancy induced hypertension    Hx of leukocytosis    mild   Iron deficiency anemia    Joint pain    Lower extremity edema    Migraines    Morbid obesity ([place])    Pain in both lower legs    Plantar fasciitis    Prediabetes    Right knee pain    Seasonal allergies    Seizures ([place])    Sleep apnea    Vitamin B 12 deficiency    Vitamin D deficiency    Past Surgical History:  Procedure Laterality Date   CESAREAN SECTION  11-02-2007   CRANIOTOMY FOR TEMPORAL LOBECTOMY     GYNECOLOGIC CRYOSURGERY     IUD REMOVAL  12-16-2009   REMOVAL THYROID CYST  2000   Patient Active Problem List   Diagnosis Date Noted   Alopecia 05/31/2022   Postmenopausal bleeding 05/31/2022   Asthma without status asthmaticus 05/31/2022   Gastroesophageal reflux disease 05/31/2022   Common migraine 05/31/2022   Sacroiliac joint dysfunction of right side 11/10/2021   Plantar fasciitis, bilateral 11/10/2021   Patellofemoral pain syndrome of both knees 08/21/2021   Vitamin D deficiency 05/12/2020   Prediabetes 05/12/2020   Class 3 severe obesity with serious comorbidity and body mass index (BMI) of 50.0 to 59.9 in adult [facility]) 05/12/2020   Unspecified deficiency anemia 02/20/2012   Seizure disorder ([place]) 02/20/2012   Leucocytosis 02/20/2012    PCP: Jonathon Jordan, MD   REFERRING PROVIDER:   Vanetta Mulders, MD    REFERRING DIAG:  Diagnosis  M25.561,G89.29 (ICD-10-CM) - Chronic pain of right knee  M25.562,G89.29 (ICD-10-CM) - Chronic pain of left knee    THERAPY DIAG:  No diagnosis  found.  Rationale for Evaluation and Treatment: Rehabilitation  ONSET DATE: 10/23  SUBJECTIVE:   SUBJECTIVE STATEMENT: Pt states the knees starting hurt really badly during the holiday season. She started getting burning into the R knee and then into the L knee. She notes that this has become increasingly painful since she began a sedentary job. She is experiencing popping. She does feel like the knee is giving out as well. Pt states that on NYE she could not walk and heard cracking/popping with extreme pain. Pt has received injections into bilat knees, lasted for for a short time. Pt is extremely stiff in the morning. Pt will have occasionally buckling. Pt was unable to go on her vacation due to her knee pain and fear of not being able to stand up from the sand.   Pt does do leg press, sit ups, and walking on the TM. She is a member of [employer]- just started there. Walks at [employer] daily. She walks 30 mins total there.   Pt denies cancer red flags.   PERTINENT HISTORY: Asthma, hx of seizures (none since 2013), sleep apnea, anemia, anxiety, prediabetes   PAIN:  Are you having pain? Yes: NPRS scale: 2/10 Pain location: R knee medially; L knee lateral and posteriorly Pain description: Burning, sharp, tight Aggravating factors: sitting>  30 minutes, ascending/descending stairs, getting out of a chair, standing > 15 minutes Relieving factors:  pain medication, heating pad, movement/exercise    PRECAUTIONS: None   WEIGHT BEARING RESTRICTIONS No   FALLS:  Has patient fallen in last 6 months? No   LIVING ENVIRONMENT: Lives with: lives with their family Lives in: House/apartment Stairs: No Has following equipment at home: None   OCCUPATION: Environmental consultant; does travel for work    PLOF: Independent   PATIENT GOALS: able to do things with her son; be able to go on vacation     OBJECTIVE:   DIAGNOSTIC FINDINGS:  R knee x ray  IMPRESSION: Moderate  tricompartmental osteoarthritis, most prominent in the medial tibiofemoral compartment.  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 Small joint effusion.    PATIENT SURVEYS:  FOTO 23  47 @ DC  19 pts MCII   COGNITION: Overall cognitive status: Within functional limits for tasks assessed     SENSATION: WFL   MUSCLE LENGTH: Positive Ely's Positive HS 90/90  POSTURE: No Significant postural limitations  PALPATION: TTP bilat knee joint lines; medial on R; more lateral pain on L; patellar tendon sensitivity  LOWER EXTREMITY ROM: moderately limited at bilateral knees due to joint stiffness, pain, and body habitus; hip ROM deferred today   LOWER EXTREMITY MMT:  MMT Right eval Left eval  Hip flexion    Hip extension    Hip abduction 32.9 33.2  Hip adduction    Hip internal rotation    Hip external rotation    Knee flexion 34.3 34.3  Knee extension 43.0 44.0   (Blank rows = not tested)   FUNCTIONAL TESTS:  5 times sit to stand: 17.5s   GAIT: Distance walked: 264f Assistive device utilized: None Level of assistance: Complete Independence Comments: bilat Trendelenberg; progressively worsens with duration of walking   TODAY'S TREATMENT:                                                                                                                              DATE: 1/19   Exercises ([place]) - Sitting Knee Extension with Resistance  - 1 x daily - 7 x weekly - 3 sets - 10 reps - Seated Hamstring Curl with Anchored Resistance  - 1 x daily - 7 x weekly - 3 sets - 10 reps - Sit to Stand with Arms Crossed  - 1 x daily - 7 x weekly - 4 sets - 5 reps    PATIENT EDUCATION:  Education details: MOI, diagnosis, prognosis, anatomy, exercise progression, DOMS expectations, muscle firing,  envelope of function, HEP, POC  Person educated: Patient Education method: Explanation, Demonstration, Tactile cues,  Verbal cues, and Handouts Education comprehension: verbalized understanding, returned demonstration, verbal cues required, and tactile cues required  HOME EXERCISE PROGRAM:  Access Code: [email or web address] URL: [email or web address] Date: 11/23/2022 Prepared by: ADaleen Bo ASSESSMENT:  CLINICAL IMPRESSION: Patient is  a 52 y.o. female who was seen today for physical therapy evaluation and treatment for c/c of bilateral knee pain. Pt's s/s appear consistent with progressive knee OA and lack of supportive LE strength. Pt's pain is highly sensitive and irritable with increasing pain with static loading. Pt likely to benefit from aquatic environment due to current mobility limitations and body habitus. Pt is a member of a gym and would like to set up an exercise program for independent management of pain and progression of functional capacity. Pt would benefit from continued skilled therapy in order to reach goals and maximize functional bilat LE strength and ROM for prevention of further functional decline.   OBJECTIVE IMPAIRMENTS Abnormal gait, decreased activity tolerance, decreased balance, decreased endurance, decreased mobility, difficulty walking, decreased ROM, decreased strength, hypomobility, increased edema, impaired flexibility, improper body mechanics, postural dysfunction, obesity, and pain.    ACTIVITY LIMITATIONS carrying, lifting, bending, sitting, standing, squatting, and stairs   PARTICIPATION LIMITATIONS: cleaning, laundry, driving, shopping, community activity, occupation, and yard work   PERSONAL FACTORS Time since onset of injury/illness/exacerbation, 3+ comorbidities: are also affecting patient's functional outcome.    REHAB POTENTIAL: Good   CLINICAL DECISION MAKING: Low/stable   EVALUATION COMPLEXITY: low     GOALS: SHORT TERM GOALS: Target date: 12/31/2022   Pt will become independent with HEP in order to demonstrate synthesis of PT education.   Goal  status: INITIAL   2.  Pt will score >/= 19 on FOTO to demonstrate improvement in perceived L LE function.    Goal status: INITIAL   3.  Pt will report at least 2 pt reduction on NPRS scale for pain during ADL in order to demonstrate functional improvement with household activity, self care, and ADL.    Goal status: INITIAL     LONG TERM GOALS: Target date: 02/11/2023     Pt  will become independent with final HEP in order to demonstrate synthesis of PT education.   Goal status: INITIAL   2.  Pt will be able to demonstrate ascending and descending stairs with reciprocal pattern in order to demonstrate functional improvement in LE function for self-care and house hold duties.     Goal status: INITIAL   3.  Pt will score >/= 47 on FOTO to demonstrate improvement in perceived bilat Knee function.    Goal status: INITIAL   4.  Pt will be able to demonstrate/report ability to walk > 50 mins  in order to demonstrate functional improvement and tolerance to exercise and community mobility.   Goal status: INITIAL         PLAN: PT FREQUENCY: 1-2x/week   PT DURATION: 12weeks (likely to D/C in 10)   PLANNED INTERVENTIONS: Therapeutic exercises, Therapeutic activity, Neuromuscular re-education, Balance training, Gait training, Patient/Family education, Self Care, Joint mobilization, Joint manipulation, Stair training, Orthotic/Fit training, Aquatic Therapy, Dry Needling, Electrical stimulation, Spinal manipulation, Spinal mobilization, Cryotherapy, Moist heat, Taping, Traction, Biofeedback, Ionotophoresis '4mg'$ /ml Dexamethasone, Manual therapy, and Re-evaluation   PLAN FOR NEXT SESSION: aquatic session: quad, HS, and hip strength, bilat knee flexibility, general conditioning    Denton Meek, PTMPT 12/11/2022, 9:12 PM

## 2022-12-12 ENCOUNTER — Encounter (HOSPITAL_BASED_OUTPATIENT_CLINIC_OR_DEPARTMENT_OTHER): Payer: Self-pay

## 2022-12-12 ENCOUNTER — Ambulatory Visit (HOSPITAL_BASED_OUTPATIENT_CLINIC_OR_DEPARTMENT_OTHER): Payer: BC Managed Care – PPO | Attending: Orthopaedic Surgery | Admitting: Physical Therapy

## 2022-12-12 DIAGNOSIS — G8929 Other chronic pain: Secondary | ICD-10-CM | POA: Insufficient documentation

## 2022-12-12 DIAGNOSIS — M6281 Muscle weakness (generalized): Secondary | ICD-10-CM | POA: Insufficient documentation

## 2022-12-12 DIAGNOSIS — R262 Difficulty in walking, not elsewhere classified: Secondary | ICD-10-CM | POA: Insufficient documentation

## 2022-12-12 DIAGNOSIS — M25561 Pain in right knee: Secondary | ICD-10-CM | POA: Insufficient documentation

## 2022-12-12 DIAGNOSIS — M25562 Pain in left knee: Secondary | ICD-10-CM | POA: Insufficient documentation

## 2022-12-13 ENCOUNTER — Encounter: Payer: Self-pay | Admitting: Hematology and Oncology

## 2022-12-13 DIAGNOSIS — Z0000 Encounter for general adult medical examination without abnormal findings: Secondary | ICD-10-CM | POA: Diagnosis not present

## 2022-12-14 ENCOUNTER — Ambulatory Visit (HOSPITAL_BASED_OUTPATIENT_CLINIC_OR_DEPARTMENT_OTHER): Payer: BC Managed Care – PPO | Admitting: Physical Therapy

## 2022-12-14 DIAGNOSIS — Z13220 Encounter for screening for lipoid disorders: Secondary | ICD-10-CM | POA: Diagnosis not present

## 2022-12-14 DIAGNOSIS — R7303 Prediabetes: Secondary | ICD-10-CM | POA: Diagnosis not present

## 2022-12-14 DIAGNOSIS — E559 Vitamin D deficiency, unspecified: Secondary | ICD-10-CM | POA: Diagnosis not present

## 2022-12-14 DIAGNOSIS — Z0000 Encounter for general adult medical examination without abnormal findings: Secondary | ICD-10-CM | POA: Diagnosis not present

## 2022-12-19 ENCOUNTER — Ambulatory Visit (HOSPITAL_BASED_OUTPATIENT_CLINIC_OR_DEPARTMENT_OTHER): Payer: BC Managed Care – PPO | Admitting: Orthopaedic Surgery

## 2022-12-19 ENCOUNTER — Ambulatory Visit (HOSPITAL_BASED_OUTPATIENT_CLINIC_OR_DEPARTMENT_OTHER): Payer: BC Managed Care – PPO | Admitting: Physical Therapy

## 2022-12-19 DIAGNOSIS — R7303 Prediabetes: Secondary | ICD-10-CM | POA: Diagnosis not present

## 2022-12-21 ENCOUNTER — Ambulatory Visit (HOSPITAL_BASED_OUTPATIENT_CLINIC_OR_DEPARTMENT_OTHER): Payer: BC Managed Care – PPO | Admitting: Physical Therapy

## 2022-12-21 ENCOUNTER — Encounter (HOSPITAL_BASED_OUTPATIENT_CLINIC_OR_DEPARTMENT_OTHER): Payer: Self-pay | Admitting: Physical Therapy

## 2022-12-24 ENCOUNTER — Ambulatory Visit (HOSPITAL_BASED_OUTPATIENT_CLINIC_OR_DEPARTMENT_OTHER): Payer: BC Managed Care – PPO | Admitting: Physical Therapy

## 2022-12-28 ENCOUNTER — Encounter (HOSPITAL_BASED_OUTPATIENT_CLINIC_OR_DEPARTMENT_OTHER): Payer: BC Managed Care – PPO | Admitting: Physical Therapy

## 2023-01-01 ENCOUNTER — Encounter (HOSPITAL_BASED_OUTPATIENT_CLINIC_OR_DEPARTMENT_OTHER): Payer: BC Managed Care – PPO | Admitting: Physical Therapy

## 2023-01-04 ENCOUNTER — Encounter (HOSPITAL_BASED_OUTPATIENT_CLINIC_OR_DEPARTMENT_OTHER): Payer: BC Managed Care – PPO | Admitting: Physical Therapy

## 2023-01-04 ENCOUNTER — Ambulatory Visit (INDEPENDENT_AMBULATORY_CARE_PROVIDER_SITE_OTHER): Payer: BC Managed Care – PPO | Admitting: Orthopaedic Surgery

## 2023-01-04 DIAGNOSIS — M25562 Pain in left knee: Secondary | ICD-10-CM | POA: Diagnosis not present

## 2023-01-04 DIAGNOSIS — M25561 Pain in right knee: Secondary | ICD-10-CM

## 2023-01-04 DIAGNOSIS — G8929 Other chronic pain: Secondary | ICD-10-CM

## 2023-01-04 NOTE — Progress Notes (Signed)
Chief Complaint: Knee pain     History of Present Illness:  01/04/2023: Today with bilateral persistent knee pain.  She is experiencing crepitus about the right knee.  She is here today for further discussion.  Tracy Mcpherson is a 52 y.o. female presents today with bilateral knee pain right worse than left.  This is been really over the last several years although it has been getting quite painful and tight since October.  There is pain predominantly medially.  The right is worse than the left.  She has difficulty time walking and going to [place].  She notes that this has become increasingly painful since she began a sedentary job.  She is experiencing popping.  She does feel like the knee is giving out as well.    Surgical History:   None  PMH/PSH/Family History/Social History/Meds/Allergies:    Past Medical History:  Diagnosis Date   Anemia    Anxiety    Asthma    Constipation    Epilepsy ([place])    Fibroids    History of gestational diabetes    History of herpes simplex type 2 infection    History of pregnancy induced hypertension    Hx of leukocytosis    mild   Iron deficiency anemia    Joint pain    Lower extremity edema    Migraines    Morbid obesity ([facility])    Pain in both lower legs    Plantar fasciitis    Prediabetes    Right knee pain    Seasonal allergies    Seizures ([place])    Sleep apnea    Vitamin B 12 deficiency    Vitamin D deficiency    Past Surgical History:  Procedure Laterality Date   CESAREAN SECTION  11-02-2007   CRANIOTOMY FOR TEMPORAL LOBECTOMY     GYNECOLOGIC CRYOSURGERY     IUD REMOVAL  12-16-2009   REMOVAL THYROID CYST  2000   Social History   Socioeconomic History   Marital status: Legally Separated    Spouse name: Not on file   Number of children: 1   Years of education: Not on file   Highest education level: Not on file  Occupational History   Occupation: Tax adviser  Tobacco Use    Smoking status: Never   Smokeless tobacco: Never  Vaping Use   Vaping Use: Never used  Substance and Sexual Activity   Alcohol use: No   Drug use: No   Sexual activity: Not Currently    Birth control/protection: None  Other Topics Concern   Not on file  Social History Narrative   Not on file   Social Determinants of Health   Financial Resource Strain: Not on file  Food Insecurity: No Food Insecurity (04/20/2022)   Hunger Vital Sign    Worried About Running Out of Food in the Last Year: Never true    Ran Out of Food in the Last Year: Never true  Transportation Needs: No Transportation Needs (04/20/2022)   PRAPARE - Hydrologist (Medical): No    Lack of Transportation (Non-Medical): No  Physical Activity: Not on file  Stress: Not on file  Social Connections: Not on file   Family History  Problem Relation Age of Onset   Hypertension Mother  Obesity Mother    Fibroids Mother    Hypertension Sister    Cancer Maternal Uncle        lung   Cancer Maternal Grandmother        lung   Kidney disease Maternal Grandfather    Alzheimer's disease Paternal Grandmother    Allergies  Allergen Reactions   Flexeril [Cyclobenzaprine Hcl] Hives   Lamictal [Lamotrigine] Other (See Comments)    Muscles  Tightened.   Morphine And Related Hives   Percocet [Oxycodone-Acetaminophen] Hives   Doxycycline Hives   Topiramate Other (See Comments)    Affecting her thinking   Vimpat [Lacosamide]     Muscle tightness    Current Outpatient Medications  Medication Sig Dispense Refill   albuterol (VENTOLIN HFA) 108 (90 Base) MCG/ACT inhaler Inhale into the lungs.     ASHWAGANDHA PO Take by mouth daily.     Azelastine HCl 137 MCG/SPRAY SOLN Place 1 spray into both nostrils 2 (two) times daily.     diclofenac (VOLTAREN) 75 MG EC tablet Take 1 tablet (75 mg total) by mouth 2 (two) times daily as needed. 60 tablet 3   fluocinonide ointment (LIDEX) 0.05 % Apply topically.      Iron-FA-B Cmp-C-Biot-Probiotic (FUSION PLUS) CAPS Take 1 capsule by mouth daily.     ketotifen (ZADITOR) 0.025 % ophthalmic solution 1 drop as needed.     Multiple Vitamin (MULTIVITAMIN WITH MINERALS) TABS tablet Take 1 tablet by mouth daily.     triamcinolone ointment (KENALOG) 0.1 % SMARTSIG:Topical 4 Times a Week     Vitamin D, Ergocalciferol, (DRISDOL) 1.25 MG (50000 UNIT) CAPS capsule Take 1 capsule (50,000 Units total) by mouth every 3 (three) days. (Patient taking differently: Take 50,000 Units by mouth once a week.) 10 capsule 0   No current facility-administered medications for this visit.   No results found.  Review of Systems:   A ROS was performed including pertinent positives and negatives as documented in the HPI.  Physical Exam :   Constitutional: NAD and appears stated age Neurological: Alert and oriented Psych: Appropriate affect and cooperative Last menstrual period 10/13/2019.   Comprehensive Musculoskeletal Exam:    Tenderness palpation about the medial lateral joint lines on the right and left.  There is significant crepitus.  Range of motion is from 0 to 120 degrees on both sides.  Imaging:   Xray (4 views right knee, 4 views left knee): There is significant medial joint space narrowing of both although left worse than right with regard to the tibiofemoral joint spaces    I personally reviewed and interpreted the radiographs.   Assessment:   52 y.o. 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  She did get good relief from injections initially.  I did describe that given her young age I would avoid any type of surgical intervention.  Specifically I would try to avoid knee arthroplasty.  I did discuss that additional injections including hyaluronic acid and PRP for which she will do research on.  At this time she would like to continue her strengthening program as she is getting some relief from this. Plan :    -Return to  clinic as needed      I personally saw and evaluated the patient, and participated in the management and treatment plan.  Vanetta Mulders, MD Attending Physician, Orthopedic Surgery  This document was dictated using Dragon voice recognition software. A reasonable attempt at proof reading has been made to minimize  errors.

## 2023-02-18 ENCOUNTER — Telehealth (HOSPITAL_BASED_OUTPATIENT_CLINIC_OR_DEPARTMENT_OTHER): Payer: Self-pay | Admitting: Orthopaedic Surgery

## 2023-02-18 ENCOUNTER — Other Ambulatory Visit (HOSPITAL_BASED_OUTPATIENT_CLINIC_OR_DEPARTMENT_OTHER): Payer: Self-pay | Admitting: Orthopaedic Surgery

## 2023-02-18 ENCOUNTER — Encounter: Payer: Self-pay | Admitting: *Deleted

## 2023-02-18 MED ORDER — DICLOFENAC SODIUM 75 MG PO TBEC: 75.0000 mg | DELAYED_RELEASE_TABLET | Freq: Two times a day (BID) | ORAL | 3 refills | Status: DC

## 2023-02-18 NOTE — Telephone Encounter (Signed)
RC to patient informing Rx was sent

## 2023-02-18 NOTE — Telephone Encounter (Signed)
Patient would like to get a refill on viflotren pill. That Dr Salley Scarlet last prescribe for her

## 2023-02-28 DIAGNOSIS — G4710 Hypersomnia, unspecified: Secondary | ICD-10-CM | POA: Diagnosis not present

## 2023-02-28 DIAGNOSIS — G4733 Obstructive sleep apnea (adult) (pediatric): Secondary | ICD-10-CM | POA: Diagnosis not present

## 2023-03-13 ENCOUNTER — Ambulatory Visit (INDEPENDENT_AMBULATORY_CARE_PROVIDER_SITE_OTHER): Payer: BC Managed Care – PPO | Admitting: Student

## 2023-03-13 ENCOUNTER — Encounter (HOSPITAL_BASED_OUTPATIENT_CLINIC_OR_DEPARTMENT_OTHER): Payer: Self-pay | Admitting: Orthopaedic Surgery

## 2023-03-13 ENCOUNTER — Encounter: Payer: Self-pay | Admitting: Hematology and Oncology

## 2023-03-13 DIAGNOSIS — M25561 Pain in right knee: Secondary | ICD-10-CM

## 2023-03-13 DIAGNOSIS — G8929 Other chronic pain: Secondary | ICD-10-CM

## 2023-03-13 DIAGNOSIS — M25562 Pain in left knee: Secondary | ICD-10-CM | POA: Diagnosis not present

## 2023-03-13 MED ORDER — TRIAMCINOLONE ACETONIDE 40 MG/ML IJ SUSP
2.0000 mL | INTRAMUSCULAR | Status: AC | PRN
  Administered 2023-03-13: 2 mL via INTRA_ARTICULAR

## 2023-03-13 MED ORDER — LIDOCAINE HCL 1 % IJ SOLN
4.0000 mL | INTRAMUSCULAR | Status: AC | PRN
  Administered 2023-03-13: 4 mL

## 2023-03-13 NOTE — Progress Notes (Signed)
Chief Complaint: Knee pain     History of Present Illness:  03/13/2023: Patient presents today for pain in bilateral knees, right worse than left.  She did receive bilateral injections on 11/09/2022 and has gotten good relief.  She states that recently her right knee has become more painful and has been making it difficult to exercise.  Denies any recent injury.  Does report that she was diagnosed with lymphedema.  She has been taking diclofenac which helps some and has tried stretching.  Denies any radiating symptoms.  Tracy Mcpherson is a 52 y.o. female presents today with bilateral knee pain right worse than left.  This is been really over the last several years although it has been getting quite painful and tight since October.  There is pain predominantly medially.  The right is worse than the left.  She has difficulty time walking and going to [place].  She notes that this has become increasingly painful since she began a sedentary job.  She is experiencing popping.  She does feel like the knee is giving out as well.    Surgical History:   None  PMH/PSH/Family History/Social History/Meds/Allergies:    Past Medical History:  Diagnosis Date   Anemia    Anxiety    Asthma    Constipation    Epilepsy ([facility])    Fibroids    History of gestational diabetes    History of herpes simplex type 2 infection    History of pregnancy induced hypertension    Hx of leukocytosis    mild   Iron deficiency anemia    Joint pain    Lower extremity edema    Migraines    Morbid obesity ([facility])    Pain in both lower legs    Plantar fasciitis    Prediabetes    Right knee pain    Seasonal allergies    Seizures ([facility])    Sleep apnea    Vitamin B 12 deficiency    Vitamin D deficiency    Past Surgical History:  Procedure Laterality Date   CESAREAN SECTION  11-02-2007   CRANIOTOMY FOR TEMPORAL LOBECTOMY     GYNECOLOGIC CRYOSURGERY     IUD REMOVAL  12-16-2009    REMOVAL THYROID CYST  2000   Social History   Socioeconomic History   Marital status: Legally Separated    Spouse name: Not on file   Number of children: 1   Years of education: Not on file   Highest education level: Not on file  Occupational History   Occupation: Consulting civil engineer  Tobacco Use   Smoking status: Never   Smokeless tobacco: Never  Vaping Use   Vaping Use: Never used  Substance and Sexual Activity   Alcohol use: No   Drug use: No   Sexual activity: Not Currently    Birth control/protection: None  Other Topics Concern   Not on file  Social History Narrative   Not on file   Social Determinants of Health   Financial Resource Strain: Not on file  Food Insecurity: No Food Insecurity (04/20/2022)   Hunger Vital Sign    Worried About Running Out of Food in the Last Year: Never true    Ran Out of Food in the Last Year: Never true  Transportation Needs: No Transportation Needs (04/20/2022)  PRAPARE - Administrator, Civil Service (Medical): No    Lack of Transportation (Non-Medical): No  Physical Activity: Not on file  Stress: Not on file  Social Connections: Not on file   Family History  Problem Relation Age of Onset   Hypertension Mother    Obesity Mother    Fibroids Mother    Hypertension Sister    Cancer Maternal Uncle        lung   Cancer Maternal Grandmother        lung   Kidney disease Maternal Grandfather    Alzheimer's disease Paternal Grandmother    Allergies  Allergen Reactions   Flexeril [Cyclobenzaprine Hcl] Hives   Lamictal [Lamotrigine] Other (See Comments)    Muscles  Tightened.   Morphine And Related Hives   Percocet [Oxycodone-Acetaminophen] Hives   Doxycycline Hives   Topiramate Other (See Comments)    Affecting her thinking   Vimpat [Lacosamide]     Muscle tightness    Current Outpatient Medications  Medication Sig Dispense Refill   albuterol (VENTOLIN HFA) 108 (90 Base) MCG/ACT inhaler Inhale into the lungs.      ASHWAGANDHA PO Take by mouth daily.     Azelastine HCl 137 MCG/SPRAY SOLN Place 1 spray into both nostrils 2 (two) times daily.     diclofenac (VOLTAREN) 75 MG EC tablet Take 1 tablet (75 mg total) by mouth 2 (two) times daily as needed. 60 tablet 3   diclofenac (VOLTAREN) 75 MG EC tablet Take 1 tablet (75 mg total) by mouth 2 (two) times daily. 30 tablet 3   fluocinonide ointment (LIDEX) 0.05 % Apply topically.     Iron-FA-B Cmp-C-Biot-Probiotic (FUSION PLUS) CAPS Take 1 capsule by mouth daily.     ketotifen (ZADITOR) 0.025 % ophthalmic solution 1 drop as needed.     Multiple Vitamin (MULTIVITAMIN WITH MINERALS) TABS tablet Take 1 tablet by mouth daily.     triamcinolone ointment (KENALOG) 0.1 % SMARTSIG:Topical 4 Times a Week     Vitamin D, Ergocalciferol, (DRISDOL) 1.25 MG (50000 UNIT) CAPS capsule Take 1 capsule (50,000 Units total) by mouth every 3 (three) days. (Patient taking differently: Take 50,000 Units by mouth once a week.) 10 capsule 0   No current facility-administered medications for this visit.   No results found.  Review of Systems:   A ROS was performed including pertinent positives and negatives as documented in the HPI.  Physical Exam :   Constitutional: NAD and appears stated age Neurological: Alert and oriented Psych: Appropriate affect and cooperative Last menstrual period 10/13/2019.   Comprehensive Musculoskeletal Exam:    Patient has tenderness to palpation over bilateral medial joint lines.  Right knee is also tender over the lateral aspect.  Active range of motion 0 to 110 degrees bilaterally.  Crepitus noted with flexion and extension.   Imaging:     I personally reviewed and interpreted the radiographs.   Assessment:   52 y.o. female with bilateral knee osteoarthritis presenting with worsening pain in the right knee.  Patient had previously been stretching and exercising which had been helping however this has become difficult due to her pain.   She did get good relief after the last injection and I offered to repeat this for the right knee today.  Ultrasound-guided cortisone injection was performed of the right knee without complication.  She has investigated different injection options and is interested in PRP, however she said she is unable to pursue this at this time.  She would like another referral to physical therapy as she was previously unable to get in around her work schedule.  Will plan to see her back in clinic as needed.  Plan :    -Referral to physical therapy and return to clinic as needed     Procedure Note  Patient: Tracy Mcpherson             Date of Birth: January 01, 1971           MRN: [id number]             Visit Date: 03/13/2023  Procedures: Visit Diagnoses:  1. Chronic pain of right knee   2. Chronic pain of left knee     Large Joint Inj: R knee on 03/13/2023 10:33 AM Indications: pain Details: 22 G 1.5 in needle, ultrasound-guided anterior approach Medications: 4 mL lidocaine 1 %; 2 mL triamcinolone acetonide 40 MG/ML Outcome: tolerated well, no immediate complications Consent was given by the patient. Patient was prepped and draped in the usual sterile fashion.      I personally saw and evaluated the patient, and participated in the management and treatment plan.  Hazle Nordmann, PA-C Orthopedics  This document was dictated using Conservation officer, historic buildings. A reasonable attempt at proof reading has been made to minimize errors.

## 2023-03-29 DIAGNOSIS — J309 Allergic rhinitis, unspecified: Secondary | ICD-10-CM | POA: Diagnosis not present

## 2023-03-29 DIAGNOSIS — J45909 Unspecified asthma, uncomplicated: Secondary | ICD-10-CM | POA: Diagnosis not present

## 2023-03-29 DIAGNOSIS — J4540 Moderate persistent asthma, uncomplicated: Secondary | ICD-10-CM | POA: Diagnosis not present

## 2023-03-29 DIAGNOSIS — F419 Anxiety disorder, unspecified: Secondary | ICD-10-CM | POA: Diagnosis not present

## 2023-03-29 DIAGNOSIS — G4733 Obstructive sleep apnea (adult) (pediatric): Secondary | ICD-10-CM | POA: Diagnosis not present

## 2023-03-29 DIAGNOSIS — R0602 Shortness of breath: Secondary | ICD-10-CM | POA: Diagnosis not present

## 2023-03-30 ENCOUNTER — Ambulatory Visit (HOSPITAL_BASED_OUTPATIENT_CLINIC_OR_DEPARTMENT_OTHER): Payer: BC Managed Care – PPO | Attending: Student | Admitting: Physical Therapy

## 2023-03-30 ENCOUNTER — Other Ambulatory Visit: Payer: Self-pay

## 2023-03-30 DIAGNOSIS — G4733 Obstructive sleep apnea (adult) (pediatric): Secondary | ICD-10-CM | POA: Diagnosis not present

## 2023-03-30 DIAGNOSIS — M5459 Other low back pain: Secondary | ICD-10-CM | POA: Insufficient documentation

## 2023-03-30 DIAGNOSIS — R262 Difficulty in walking, not elsewhere classified: Secondary | ICD-10-CM | POA: Insufficient documentation

## 2023-03-30 DIAGNOSIS — M25562 Pain in left knee: Secondary | ICD-10-CM | POA: Insufficient documentation

## 2023-03-30 DIAGNOSIS — G8929 Other chronic pain: Secondary | ICD-10-CM | POA: Insufficient documentation

## 2023-03-30 DIAGNOSIS — M6281 Muscle weakness (generalized): Secondary | ICD-10-CM | POA: Insufficient documentation

## 2023-03-30 DIAGNOSIS — G4710 Hypersomnia, unspecified: Secondary | ICD-10-CM | POA: Diagnosis not present

## 2023-03-30 DIAGNOSIS — M25561 Pain in right knee: Secondary | ICD-10-CM | POA: Diagnosis not present

## 2023-03-30 NOTE — Therapy (Signed)
OUTPATIENT PHYSICAL THERAPY LOWER EXTREMITY EVALUATION   Patient Name: Tracy Mcpherson MRN: [id number] DOB:13-Feb-1971, 52 y.o., female Today's Date: 11/23/2022  END OF SESSION:  PT End of Session - 11/23/22 0941     Visit Number 1    Number of Visits 16    Date for PT Re-Evaluation 02/21/23    Authorization Type BCBS    PT Start Time [phone]    PT Stop Time 0935    PT Time Calculation (min) 46 min    Activity Tolerance Patient tolerated treatment well;Patient limited by pain    Behavior During Therapy WFL for tasks assessed/performed             Past Medical History:  Diagnosis Date   Anemia    Anxiety    Asthma    Constipation    Epilepsy (HCC)    Fibroids    History of gestational diabetes    History of herpes simplex type 2 infection    History of pregnancy induced hypertension    Hx of leukocytosis    mild   Iron deficiency anemia    Joint pain    Lower extremity edema    Migraines    Morbid obesity (HCC)    Pain in both lower legs    Plantar fasciitis    Prediabetes    Right knee pain    Seasonal allergies    Seizures (HCC)    Sleep apnea    Vitamin B 12 deficiency    Vitamin D deficiency    Past Surgical History:  Procedure Laterality Date   CESAREAN SECTION  11-02-2007   CRANIOTOMY FOR TEMPORAL LOBECTOMY     GYNECOLOGIC CRYOSURGERY     IUD REMOVAL  12-16-2009   REMOVAL THYROID CYST  2000   Patient Active Problem List   Diagnosis Date Noted   Alopecia 05/31/2022   Postmenopausal bleeding 05/31/2022   Asthma without status asthmaticus 05/31/2022   Gastroesophageal reflux disease 05/31/2022   Common migraine 05/31/2022   Sacroiliac joint dysfunction of right side 11/10/2021   Plantar fasciitis, bilateral 11/10/2021   Patellofemoral pain syndrome of both knees 08/21/2021   Vitamin D deficiency 05/12/2020   Prediabetes 05/12/2020   Class 3 severe obesity with serious comorbidity and body mass index (BMI) of 50.0 to 59.9 in adult [facility])  05/12/2020   Unspecified deficiency anemia 02/20/2012   Seizure disorder (HCC) 02/20/2012   Leucocytosis 02/20/2012    PCP: Mila Palmer, MD   REFERRING PROVIDER:   Huel Cote, MD    REFERRING DIAG:  Diagnosis  M25.561,G89.29 (ICD-10-CM) - Chronic pain of right knee  M25.562,G89.29 (ICD-10-CM) - Chronic pain of left knee    THERAPY DIAG:  Chronic pain of left knee - Plan: PT plan of care cert/re-cert  Chronic pain of right knee - Plan: PT plan of care cert/re-cert  Muscle weakness (generalized) - Plan: PT plan of care cert/re-cert  Difficulty in walking, not elsewhere classified - Plan: PT plan of care cert/re-cert  Rationale for Evaluation and Treatment: Rehabilitation  ONSET DATE: 10/23  SUBJECTIVE:   SUBJECTIVE STATEMENT:  Patient returns for follow up eval. 2-3 weeks ago she flet a pop on the left leg and almost fell. Her left one hurts now and then. The right one becomes very stiff and swollen. She felt a pop in her right knee a few weeks ago. She had a shot in her knee 2 weeks ago. It has helped the pain but not the swelli gn. She sits at  work and it stiffens. She is having a hard time transfering from sit to stand.   Eval in January: Pt states the knees starting hurt really badly during the holiday season. She started getting burning into the R knee and then into the L knee. She notes that this has become increasingly painful since she began a sedentary job. She is experiencing popping. She does feel like the knee is giving out as well. Pt states that on NYE she could not walk and heard cracking/popping with extreme pain. Pt has received injections into bilat knees, lasted for for a short time. Pt is extremely stiff in the morning. Pt will have occasionally buckling. Pt was unable to go on her vacation due to her knee pain and fear of not being able to stand up from the sand.   Pt does do leg press, sit ups, and walking on the TM. She is a member of [employer]- just started there. Walks at [employer] daily. She walks 30 mins total there.   Pt denies cancer red flags.   PERTINENT HISTORY: Asthma, hx of seizures (none since 2013), sleep apnea, anemia, anxiety, prediabetes, plantar fascitis.   PAIN:  Are you having pain? Yes: NPRS scale: 2/10 Pain location: R knee medially; L knee lateral and posteriorly Pain description: Burning, sharp, tight Aggravating factors: sitting> 30 minutes, ascending/descending stairs, getting out of a chair, standing > 15 minutes Relieving factors:  pain medication, heating pad, movement/exercise    PRECAUTIONS: None   WEIGHT BEARING RESTRICTIONS No   FALLS:  Has patient fallen in last 6 months? No   LIVING ENVIRONMENT: Lives with: lives with their family Lives in: House/apartment Stairs: No Has following equipment at home: None   OCCUPATION: Hotel manager; does travel for work    Hobbies:  Wellsite geologist to walk  [employer] to [employer]    PLOF: Independent   PATIENT GOALS: able to do things with her son; be able to go on vacation     OBJECTIVE:   DIAGNOSTIC FINDINGS:  R knee x ray  IMPRESSION: Moderate tricompartmental osteoarthritis, most prominent in the medial tibiofemoral compartment.  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 Small joint effusion.    PATIENT SURVEYS:  FOTO 31  55 @ DC  19 pts MCII   COGNITION: Overall cognitive status: Within functional limits for tasks assessed     SENSATION: WFL   MUSCLE LENGTH: Positive Ely's Positive HS 90/90  POSTURE: No Significant postural limitations  PALPATION: TTP bilat knee joint lines; medial on R; more lateral pain on L; patellar tendon sensitivity ( potentially more sensitive on medial right knee on re-assessment)   LOWER EXTREMITY ROM: moderately limited at bilateral knees due to joint stiffness, pain, and body habitus; hip ROM deferred today    LOWER EXTREMITY MMT:  MMT Right eval Left eval  Hip flexion 27.9 27.8  Hip extension    Hip abduction 15.9 20.1  Hip adduction    Hip internal rotation    Hip external rotation    Knee flexion    Knee extension 23.5 16.0   (Blank rows = not tested)  Pain with end range active and passive flexion   FUNCTIONAL TESTS:    GAIT: Distance walked: 287ft Assistive device utilized: None Level of assistance: Complete Independence Comments: bilat Trendelenberg; progressively worsens with duration of walking ( same on 2nd eval 5/25)    TODAY'S TREATMENT:  DATE: 1/19  Access Code: [email or web address] URL: [email or web address] Date: 03/30/2023 Prepared by: Lorayne Bender  Exercises - Seated Hamstring Stretch  - 1 x daily - 7 x weekly - 3 sets - 3 reps - 20sec  hold - Supine Quad Set  - 3-4 x daily - 7 x weekly - 3 sets - 10 reps - 5 sec  hold - Active Straight Leg Raise with Quad Set  - 1 x daily - 7 x weekly - 3 sets - 10 reps - Seated Hip Abduction with Resistance  - 1 x daily - 7 x weekly - 3 sets - 10 reps    PATIENT EDUCATION:  Education details: MOI, diagnosis, prognosis, anatomy, exercise progression, DOMS expectations, muscle firing,  envelope of function, HEP, POC  Person educated: Patient Education method: Explanation, Demonstration, Tactile cues, Verbal cues, and Handouts Education comprehension: verbalized understanding, returned demonstration, verbal cues required, and tactile cues required  HOME EXERCISE PROGRAM:  Access Code: [email or web address] URL: [email or web address] Date: 11/23/2022 Prepared by: Zebedee Iba  ASSESSMENT:  CLINICAL IMPRESSION: Patient is a 53 y.o. female who was seen today for physical therapy evaluation and treatment for c/c of bilateral knee pain. Pt's s/s appear consistent with progressive knee OA and  lack of supportive LE strength. She has lost strength since her intial eval in January. She was unable to attend in January because of work commitments.  Pt is a member of a gym and would like to set up an exercise program for independent management of pain and progression of functional capacity. Pt would benefit from continued skilled therapy in order to reach goals and maximize functional bilat LE strength and ROM for prevention of further functional decline.   OBJECTIVE IMPAIRMENTS Abnormal gait, decreased activity tolerance, decreased balance, decreased endurance, decreased mobility, difficulty walking, decreased ROM, decreased strength, hypomobility, increased edema, impaired flexibility, improper body mechanics, postural dysfunction, obesity, and pain.    ACTIVITY LIMITATIONS carrying, lifting, bending, sitting, standing, squatting, and stairs   PARTICIPATION LIMITATIONS: cleaning, laundry, driving, shopping, community activity, occupation, and yard work   PERSONAL FACTORS Time since onset of injury/illness/exacerbation, 3+ comorbidities: obesity, OA, anxiety and plantar fascitis  are also affecting patient's functional outcome.    REHAB POTENTIAL: Good   CLINICAL DECISION MAKING: Low/stable   EVALUATION COMPLEXITY: low     GOALS: SHORT TERM GOALS: Target date: 12/31/2022   Pt will become independent with HEP in order to demonstrate synthesis of PT education.   Goal status: INITIAL goal re-established on 4/25   2.  Pt will score >/= 19 on FOTO to demonstrate improvement in perceived L LE function.  goal re-established on 4/25   Goal status: INITIAL   3.  Pt will report at least 2 pt reduction on NPRS scale for pain during ADL in order to demonstrate functional improvement with household activity, self care, and ADL.  goal re-established on 4/25   Goal status: INITIAL     LONG TERM GOALS: Target date: 02/11/2023     Pt  will become independent with final HEP in order to  demonstrate synthesis of PT education.   goal re-established on 4/25 Goal status: INITIAL   2.  Pt will be able to demonstrate ascending and descending stairs with reciprocal pattern in order to demonstrate functional improvement in LE function for self-care and house hold duties.   goal re-established on 4/25     Goal status: INITIAL   3.  Pt will score >/= 47 on FOTO to demonstrate improvement in perceived  bilat Knee function.    Goal status: INITIAL   4.  Pt will be able to demonstrate/report ability to walk > 50 mins  in order to demonstrate functional improvement and tolerance to exercise and community mobility.   Goal status: INITIAL goal re-established on 4/25         PLAN: PT FREQUENCY: 1-2x/week   PT DURATION: 8  weeks   PLANNED INTERVENTIONS: Therapeutic exercises, Therapeutic activity, Neuromuscular re-education, Balance training, Gait training, Patient/Family education, Self Care, Joint mobilization, Joint manipulation, Stair training, Orthotic/Fit training, Aquatic Therapy, Dry Needling, Electrical stimulation, Spinal manipulation, Spinal mobilization, Cryotherapy, Moist heat, Taping, Traction, Biofeedback, Ionotophoresis 4mg /ml Dexamethasone, Manual therapy, and Re-evaluation   PLAN FOR NEXT SESSION: Begin with closed chain and straight leg hip strengthening. Progress to standing 3 way hip as tolerated. Consider low step. Consider ai-rex stability exercises. Review set up of exercise bike. Next visit review HEP; consider bridge; consider side lying hip abduction; if patient continues to have trigger points in her VMO consdier trigger point release. If patient continues to have patellar instability consider McConnel  taping.     Lorayne Bender PT PT  11/23/2022, 9:54 AM

## 2023-03-30 NOTE — Addendum Note (Signed)
Addended by: Dessie Coma on: 03/30/2023 12:29 PM   Modules accepted: Orders

## 2023-04-02 DIAGNOSIS — K080 Exfoliation of teeth due to systemic causes: Secondary | ICD-10-CM | POA: Diagnosis not present

## 2023-04-06 ENCOUNTER — Ambulatory Visit (HOSPITAL_BASED_OUTPATIENT_CLINIC_OR_DEPARTMENT_OTHER): Payer: BC Managed Care – PPO | Attending: Student | Admitting: Rehabilitative and Restorative Service Providers"

## 2023-04-06 ENCOUNTER — Encounter (HOSPITAL_BASED_OUTPATIENT_CLINIC_OR_DEPARTMENT_OTHER): Payer: Self-pay | Admitting: Rehabilitative and Restorative Service Providers"

## 2023-04-06 DIAGNOSIS — M6281 Muscle weakness (generalized): Secondary | ICD-10-CM | POA: Diagnosis not present

## 2023-04-06 DIAGNOSIS — R262 Difficulty in walking, not elsewhere classified: Secondary | ICD-10-CM | POA: Insufficient documentation

## 2023-04-06 DIAGNOSIS — M5459 Other low back pain: Secondary | ICD-10-CM | POA: Diagnosis not present

## 2023-04-06 DIAGNOSIS — G8929 Other chronic pain: Secondary | ICD-10-CM | POA: Insufficient documentation

## 2023-04-06 DIAGNOSIS — M25561 Pain in right knee: Secondary | ICD-10-CM | POA: Insufficient documentation

## 2023-04-06 DIAGNOSIS — M25562 Pain in left knee: Secondary | ICD-10-CM | POA: Diagnosis not present

## 2023-04-06 NOTE — Therapy (Signed)
OUTPATIENT PHYSICAL THERAPY LOWER EXTREMITY EVALUATION   Patient Name: Tracy Mcpherson MRN: [id number] DOB:November 20, 1970, 52 y.o., female Today's Date: 04/06/2023  END OF SESSION:  PT End of Session - 04/06/23 1016     Visit Number 2    Number of Visits 8    Date for PT Re-Evaluation 05/25/23    Authorization Type BCBS    PT Start Time 1010    PT Stop Time 1054    PT Time Calculation (min) 44 min    Activity Tolerance Patient tolerated treatment well;No increased pain    Behavior During Therapy WFL for tasks assessed/performed             Past Medical History:  Diagnosis Date   Anemia    Anxiety    Asthma    Constipation    Epilepsy (HCC)    Fibroids    History of gestational diabetes    History of herpes simplex type 2 infection    History of pregnancy induced hypertension    Hx of leukocytosis    mild   Iron deficiency anemia    Joint pain    Lower extremity edema    Migraines    Morbid obesity (HCC)    Pain in both lower legs    Plantar fasciitis    Prediabetes    Right knee pain    Seasonal allergies    Seizures (HCC)    Sleep apnea    Vitamin B 12 deficiency    Vitamin D deficiency    Past Surgical History:  Procedure Laterality Date   CESAREAN SECTION  11-02-2007   CRANIOTOMY FOR TEMPORAL LOBECTOMY     GYNECOLOGIC CRYOSURGERY     IUD REMOVAL  12-16-2009   REMOVAL THYROID CYST  2000   Patient Active Problem List   Diagnosis Date Noted   Alopecia 05/31/2022   Postmenopausal bleeding 05/31/2022   Asthma without status asthmaticus 05/31/2022   Gastroesophageal reflux disease 05/31/2022   Common migraine 05/31/2022   Sacroiliac joint dysfunction of right side 11/10/2021   Plantar fasciitis, bilateral 11/10/2021   Patellofemoral pain syndrome of both knees 08/21/2021   Vitamin D deficiency 05/12/2020   Prediabetes 05/12/2020   Class 3 severe obesity with serious comorbidity and body mass index (BMI) of 50.0 to 59.9 in adult (HCC) 05/12/2020    Unspecified deficiency anemia 02/20/2012   Seizure disorder (HCC) 02/20/2012   Leucocytosis 02/20/2012    PCP: Mila Palmer, MD   REFERRING PROVIDER:   Huel Cote, MD    REFERRING DIAG:  Diagnosis  M25.561,G89.29 (ICD-10-CM) - Chronic pain of right knee  M25.562,G89.29 (ICD-10-CM) - Chronic pain of left knee    THERAPY DIAG:  Chronic pain of left knee  Chronic pain of right knee  Muscle weakness (generalized)  Difficulty in walking, not elsewhere classified  Other low back pain  Rationale for Evaluation and Treatment: Rehabilitation  ONSET DATE: 10/23  SUBJECTIVE:   SUBJECTIVE STATEMENT:  The pain in my right knee burns (inside knee) and the bone pops out of place and the pain radiates down my leg. I massage it and I can hear it when it goes back in place. In my left knee, it doesn't hurt as much anymore and it is in the back of the knee.    PERTINENT HISTORY: Asthma, hx of seizures (none since 2013), sleep apnea, anemia, anxiety, prediabetes, plantar fascitis.   PAIN:  Are you having pain? Yes: NPRS scale: 3/10 Pain location: R knee medially; L knee  lateral and posteriorly Pain description: Burning, sharp, tight Aggravating factors: sitting> 30 minutes, ascending/descending stairs, getting out of a chair, standing > 15 minutes Relieving factors:  pain medication, heating pad, movement/exercise    PRECAUTIONS: None   WEIGHT BEARING RESTRICTIONS No   FALLS:  Has patient fallen in last 6 months? No   LIVING ENVIRONMENT: Lives with: lives with their family Lives in: House/apartment Stairs: No Has following equipment at home: None   OCCUPATION: Hotel manager; does travel for work    Hobbies:  Wellsite geologist to walk  [employer] to [employer]    PLOF: Independent   PATIENT GOALS: able to do things with her son; be able to go on vacation     OBJECTIVE:   DIAGNOSTIC FINDINGS:  R knee x ray  IMPRESSION: Moderate tricompartmental  osteoarthritis, most prominent in the medial tibiofemoral compartment.  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 Small joint effusion.    PATIENT SURVEYS:  FOTO 31  55 @ DC  19 pts MCII   COGNITION: Overall cognitive status: Within functional limits for tasks assessed     SENSATION: WFL   MUSCLE LENGTH: Positive Ely's Positive HS 90/90  POSTURE: No Significant postural limitations  PALPATION: TTP bilat knee joint lines; medial on R; more lateral pain on L; patellar tendon sensitivity ( potentially more sensitive on medial right knee on re-assessment)   LOWER EXTREMITY ROM: moderately limited at bilateral knees due to joint stiffness, pain, and body habitus; hip ROM deferred today   LOWER EXTREMITY MMT:  MMT Right eval Left eval  Hip flexion 27.9 27.8  Hip extension    Hip abduction 15.9 20.1  Hip adduction    Hip internal rotation    Hip external rotation    Knee flexion    Knee extension 23.5 16.0   (Blank rows = not tested)  Pain with end range active and passive flexion   FUNCTIONAL TESTS:    GAIT: Distance walked: 281ft Assistive device utilized: None Level of assistance: Complete Independence Comments: bilat Trendelenberg; progressively worsens with duration of walking ( same on 2nd eval 5/25)    TODAY'S TREATMENT:                                                                                                                              DATE:  [facility] Adult PT Treatment:                                                DATE: 04/06/23 Therapeutic Exercise: Quad set x 15 SLR x 15 VMO SLR x 15 S/L hip abduction x 15 Ball squeeze x 15 Ball squeeze with bridge x 15 SAQ 2 lbs unilat/bil x 15 each Seated Hamstring stretch 1 x 30 sec  Seated hip abduction with  GTB x 10 Answered questions regarding [employer] equipment All therex performed bil   1/19  Access Code:  [email or web address] URL: [email or web address] Date: 03/30/2023 Prepared by: Lorayne Bender  Exercises - Seated Hamstring Stretch  - 1 x daily - 7 x weekly - 3 sets - 3 reps - 20sec  hold - Supine Quad Set  - 3-4 x daily - 7 x weekly - 3 sets - 10 reps - 5 sec  hold - Active Straight Leg Raise with Quad Set  - 1 x daily - 7 x weekly - 3 sets - 10 reps - Seated Hip Abduction with Resistance  - 1 x daily - 7 x weekly - 3 sets - 10 reps    PATIENT EDUCATION:  Education details: MOI, diagnosis, prognosis, anatomy, exercise progression, DOMS expectations, muscle firing,  envelope of function, HEP, POC  Person educated: Patient Education method: Explanation, Demonstration, Tactile cues, Verbal cues, and Handouts Education comprehension: verbalized understanding, returned demonstration, verbal cues required, and tactile cues required  HOME EXERCISE PROGRAM:  Access Code: [email or web address] URL: [email or web address] Date: 11/23/2022 Prepared by: Zebedee Iba  ASSESSMENT:  CLINICAL IMPRESSION: Patient is a 52 y.o. female who was seen today for physical therapy treatment for c/c of bilateral knee pain. Pt's s/s appear consistent with progressive knee OA and lack of supportive LE strength. Treatment today focused on overall bil LE strengthening with pain monitored throughout and proper muscle facilitation. Pt would benefit from continued skilled therapy in order to reach goals and maximize functional bilat LE strength and ROM for prevention of further functional decline.   OBJECTIVE IMPAIRMENTS Abnormal gait, decreased activity tolerance, decreased balance, decreased endurance, decreased mobility, difficulty walking, decreased ROM, decreased strength, hypomobility, increased edema, impaired flexibility, improper body mechanics, postural dysfunction, obesity, and pain.    ACTIVITY LIMITATIONS carrying, lifting, bending, sitting, standing, squatting, and stairs   PARTICIPATION  LIMITATIONS: cleaning, laundry, driving, shopping, community activity, occupation, and yard work   PERSONAL FACTORS Time since onset of injury/illness/exacerbation, 3+ comorbidities: obesity, OA, anxiety and plantar fascitis  are also affecting patient's functional outcome.    REHAB POTENTIAL: Good   CLINICAL DECISION MAKING: Low/stable   EVALUATION COMPLEXITY: low     GOALS: SHORT TERM GOALS: Target date: 12/31/2022   Pt will become independent with HEP in order to demonstrate synthesis of PT education.   Goal status: INITIAL goal re-established on 4/25   2.  Pt will score >/= 19 on FOTO to demonstrate improvement in perceived L LE function.  goal re-established on 4/25   Goal status: INITIAL   3.  Pt will report at least 2 pt reduction on NPRS scale for pain during ADL in order to demonstrate functional improvement with household activity, self care, and ADL.  goal re-established on 4/25   Goal status: INITIAL     LONG TERM GOALS: Target date: 02/11/2023     Pt  will become independent with final HEP in order to demonstrate synthesis of PT education.   goal re-established on 4/25 Goal status: INITIAL   2.  Pt will be able to demonstrate ascending and descending stairs with reciprocal pattern in order to demonstrate functional improvement in LE function for self-care and house hold duties.   goal re-established on 4/25     Goal status: INITIAL   3.  Pt will score >/= 47 on FOTO to demonstrate improvement in perceived bilat Knee function.    Goal status: INITIAL   4.  Pt will be able to demonstrate/report  ability to walk > 50 mins  in order to demonstrate functional improvement and tolerance to exercise and community mobility.   Goal status: INITIAL goal re-established on 4/25         PLAN: PT FREQUENCY: 1-2x/week   PT DURATION: 8  weeks   PLANNED INTERVENTIONS: Therapeutic exercises, Therapeutic activity, Neuromuscular re-education, Balance training, Gait  training, Patient/Family education, Self Care, Joint mobilization, Joint manipulation, Stair training, Orthotic/Fit training, Aquatic Therapy, Dry Needling, Electrical stimulation, Spinal manipulation, Spinal mobilization, Cryotherapy, Moist heat, Taping, Traction, Biofeedback, Ionotophoresis 4mg /ml Dexamethasone, Manual therapy, and Re-evaluation   PLAN FOR NEXT SESSION: Begin with closed chain and straight leg hip strengthening. Progress to standing 3 way hip as tolerated. Consider low step. Consider ai-rex stability exercises. Review set up of exercise bike.  if patient continues to have trigger points in her VMO consider trigger point release. If patient continues to have patellar instability consider McConnel  taping. Continue bil LE strengthening.

## 2023-04-09 DIAGNOSIS — N939 Abnormal uterine and vaginal bleeding, unspecified: Secondary | ICD-10-CM | POA: Diagnosis not present

## 2023-04-09 DIAGNOSIS — N898 Other specified noninflammatory disorders of vagina: Secondary | ICD-10-CM | POA: Diagnosis not present

## 2023-04-09 DIAGNOSIS — Z124 Encounter for screening for malignant neoplasm of cervix: Secondary | ICD-10-CM | POA: Diagnosis not present

## 2023-04-09 DIAGNOSIS — F419 Anxiety disorder, unspecified: Secondary | ICD-10-CM | POA: Diagnosis not present

## 2023-04-09 DIAGNOSIS — Z131 Encounter for screening for diabetes mellitus: Secondary | ICD-10-CM | POA: Diagnosis not present

## 2023-04-09 DIAGNOSIS — Z130 Encounter for screening for diseases of the blood and blood-forming organs and certain disorders involving the immune mechanism: Secondary | ICD-10-CM | POA: Diagnosis not present

## 2023-04-09 DIAGNOSIS — Z01411 Encounter for gynecological examination (general) (routine) with abnormal findings: Secondary | ICD-10-CM | POA: Diagnosis not present

## 2023-04-09 DIAGNOSIS — R232 Flushing: Secondary | ICD-10-CM | POA: Diagnosis not present

## 2023-04-09 DIAGNOSIS — Z1151 Encounter for screening for human papillomavirus (HPV): Secondary | ICD-10-CM | POA: Diagnosis not present

## 2023-04-09 DIAGNOSIS — R4589 Other symptoms and signs involving emotional state: Secondary | ICD-10-CM | POA: Diagnosis not present

## 2023-04-13 ENCOUNTER — Ambulatory Visit (HOSPITAL_BASED_OUTPATIENT_CLINIC_OR_DEPARTMENT_OTHER): Payer: BC Managed Care – PPO | Admitting: Physical Therapy

## 2023-04-17 DIAGNOSIS — J45909 Unspecified asthma, uncomplicated: Secondary | ICD-10-CM | POA: Diagnosis not present

## 2023-04-18 DIAGNOSIS — G4733 Obstructive sleep apnea (adult) (pediatric): Secondary | ICD-10-CM | POA: Diagnosis not present

## 2023-04-18 DIAGNOSIS — M6281 Muscle weakness (generalized): Secondary | ICD-10-CM | POA: Diagnosis not present

## 2023-04-18 DIAGNOSIS — R29898 Other symptoms and signs involving the musculoskeletal system: Secondary | ICD-10-CM | POA: Diagnosis not present

## 2023-04-18 DIAGNOSIS — K219 Gastro-esophageal reflux disease without esophagitis: Secondary | ICD-10-CM | POA: Diagnosis not present

## 2023-04-18 DIAGNOSIS — Z7689 Persons encountering health services in other specified circumstances: Secondary | ICD-10-CM | POA: Diagnosis not present

## 2023-04-18 DIAGNOSIS — Z6841 Body Mass Index (BMI) 40.0 and over, adult: Secondary | ICD-10-CM | POA: Diagnosis not present

## 2023-04-18 DIAGNOSIS — Z713 Dietary counseling and surveillance: Secondary | ICD-10-CM | POA: Diagnosis not present

## 2023-04-20 ENCOUNTER — Ambulatory Visit (HOSPITAL_BASED_OUTPATIENT_CLINIC_OR_DEPARTMENT_OTHER): Payer: BC Managed Care – PPO | Admitting: Rehabilitative and Restorative Service Providers"

## 2023-04-20 ENCOUNTER — Encounter (HOSPITAL_BASED_OUTPATIENT_CLINIC_OR_DEPARTMENT_OTHER): Payer: Self-pay | Admitting: Rehabilitative and Restorative Service Providers"

## 2023-04-20 DIAGNOSIS — R262 Difficulty in walking, not elsewhere classified: Secondary | ICD-10-CM

## 2023-04-20 DIAGNOSIS — M6281 Muscle weakness (generalized): Secondary | ICD-10-CM

## 2023-04-20 DIAGNOSIS — M25562 Pain in left knee: Secondary | ICD-10-CM | POA: Diagnosis not present

## 2023-04-20 DIAGNOSIS — G8929 Other chronic pain: Secondary | ICD-10-CM

## 2023-04-20 DIAGNOSIS — M5459 Other low back pain: Secondary | ICD-10-CM | POA: Diagnosis not present

## 2023-04-20 DIAGNOSIS — M25561 Pain in right knee: Secondary | ICD-10-CM | POA: Diagnosis not present

## 2023-04-20 NOTE — Therapy (Signed)
OUTPATIENT PHYSICAL THERAPY LOWER EXTREMITY EVALUATION   Patient Name: Tracy Mcpherson MRN: [id number] DOB:08-30-1971, 52 y.o., female Today's Date: 04/20/2023  END OF SESSION:  PT End of Session - 04/20/23 1138     Visit Number 3    Number of Visits 8    Date for PT Re-Evaluation 05/25/23    Authorization Type BCBS    PT Start Time 1134    PT Stop Time 1222    PT Time Calculation (min) 48 min             Past Medical History:  Diagnosis Date   Anemia    Anxiety    Asthma    Constipation    Epilepsy (HCC)    Fibroids    History of gestational diabetes    History of herpes simplex type 2 infection    History of pregnancy induced hypertension    Hx of leukocytosis    mild   Iron deficiency anemia    Joint pain    Lower extremity edema    Migraines    Morbid obesity (HCC)    Pain in both lower legs    Plantar fasciitis    Prediabetes    Right knee pain    Seasonal allergies    Seizures (HCC)    Sleep apnea    Vitamin B 12 deficiency    Vitamin D deficiency    Past Surgical History:  Procedure Laterality Date   CESAREAN SECTION  11-02-2007   CRANIOTOMY FOR TEMPORAL LOBECTOMY     GYNECOLOGIC CRYOSURGERY     IUD REMOVAL  12-16-2009   REMOVAL THYROID CYST  2000   Patient Active Problem List   Diagnosis Date Noted   Alopecia 05/31/2022   Postmenopausal bleeding 05/31/2022   Asthma without status asthmaticus 05/31/2022   Gastroesophageal reflux disease 05/31/2022   Common migraine 05/31/2022   Sacroiliac joint dysfunction of right side 11/10/2021   Plantar fasciitis, bilateral 11/10/2021   Patellofemoral pain syndrome of both knees 08/21/2021   Vitamin D deficiency 05/12/2020   Prediabetes 05/12/2020   Class 3 severe obesity with serious comorbidity and body mass index (BMI) of 50.0 to 59.9 in adult (HCC) 05/12/2020   Unspecified deficiency anemia 02/20/2012   Seizure disorder (HCC) 02/20/2012   Leucocytosis 02/20/2012    PCP: Mila Palmer,  MD   REFERRING PROVIDER:   Huel Cote, MD    REFERRING DIAG:  Diagnosis  M25.561,G89.29 (ICD-10-CM) - Chronic pain of right knee  M25.562,G89.29 (ICD-10-CM) - Chronic pain of left knee    THERAPY DIAG:  Chronic pain of left knee  Chronic pain of right knee  Muscle weakness (generalized)  Difficulty in walking, not elsewhere classified  Rationale for Evaluation and Treatment: Rehabilitation  ONSET DATE: 10/23  SUBJECTIVE:   SUBJECTIVE STATEMENT:  I felt really good after I left last time. I haven't done the exercises or gone to the gym. When I wake up in the morning it is a sting; when I sit all day it hurts. The slip happened again yesterday in just the left knee.      PERTINENT HISTORY: Asthma, hx of seizures (none since 2013), sleep apnea, anemia, anxiety, prediabetes, plantar fascitis.   PAIN:  Are you having pain? Yes: NPRS scale: 3/10 Pain location: R knee medially; L knee lateral and posteriorly Pain description: Burning, sharp, tight Aggravating factors: sitting> 30 minutes, ascending/descending stairs, getting out of a chair, standing > 15 minutes Relieving factors:  pain medication, heating pad, movement/exercise  PRECAUTIONS: None   WEIGHT BEARING RESTRICTIONS No   FALLS:  Has patient fallen in last 6 months? No   LIVING ENVIRONMENT: Lives with: lives with their family Lives in: House/apartment Stairs: No Has following equipment at home: None   OCCUPATION: Hotel manager; does travel for work    Hobbies:  Wellsite geologist to walk  [employer] to [employer]    PLOF: Independent   PATIENT GOALS: able to do things with her son; be able to go on vacation     OBJECTIVE:   DIAGNOSTIC FINDINGS:  R knee x ray  IMPRESSION: Moderate tricompartmental osteoarthritis, most prominent in the medial tibiofemoral compartment.  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 Small  joint effusion.    PATIENT SURVEYS:  FOTO 31  55 @ DC  19 pts MCII   COGNITION: Overall cognitive status: Within functional limits for tasks assessed     SENSATION: WFL   MUSCLE LENGTH: Positive Ely's Positive HS 90/90  POSTURE: No Significant postural limitations  PALPATION: TTP bilat knee joint lines; medial on R; more lateral pain on L; patellar tendon sensitivity ( potentially more sensitive on medial right knee on re-assessment)   LOWER EXTREMITY ROM: moderately limited at bilateral knees due to joint stiffness, pain, and body habitus; hip ROM deferred today   LOWER EXTREMITY MMT:  MMT Right eval Left eval  Hip flexion 27.9 27.8  Hip extension    Hip abduction 15.9 20.1  Hip adduction    Hip internal rotation    Hip external rotation    Knee flexion    Knee extension 23.5 16.0   (Blank rows = not tested)  Pain with end range active and passive flexion   FUNCTIONAL TESTS:    GAIT: Distance walked: 266ft Assistive device utilized: None Level of assistance: Complete Independence Comments: bilat Trendelenberg; progressively worsens with duration of walking ( same on 2nd eval 5/25)    TODAY'S TREATMENT:                                                                                                                              DATE:   [facility] Adult PT Treatment:                                                DATE: 04/20/23 Therapeutic Exercise: Gait in the hallway as warmup x 2 min with PT looking for gait abnormalities Quad sets x 20 SLR x 20 VMO SLR x 20 Ball squeeze x 20 Ball squeeze with bridge x 20 Iso ball squeeze with bridge with bil shoulder flex/ext x 20 SAQ 2.5lbs x 20 SAQ 2.5lbs bil with ball squeeze x 20 HS digs x 20 with 2-3 sec hold Sidelying hip abdct x 20 2 lbs Hamstring stretch seated x 30  sec Butterfly stretch 2x30 sec All therex performed bil   [facility] Adult PT Treatment:                                                DATE:  04/06/23 Therapeutic Exercise: Quad set x 15 SLR x 15 VMO SLR x 15 S/L hip abduction x 15 Ball squeeze x 15 Ball squeeze with bridge x 15 SAQ 2 lbs unilat/bil x 15 each Seated Hamstring stretch 1 x 30 sec  Seated hip abduction with GTB x 10 Answered questions regarding [employer] gym equipment All therex performed bil   1/19  Access Code: [email or web address] URL: [email or web address] Date: 03/30/2023 Prepared by: Lorayne Bender  Exercises - Seated Hamstring Stretch  - 1 x daily - 7 x weekly - 3 sets - 3 reps - 20sec  hold - Supine Quad Set  - 3-4 x daily - 7 x weekly - 3 sets - 10 reps - 5 sec  hold - Active Straight Leg Raise with Quad Set  - 1 x daily - 7 x weekly - 3 sets - 10 reps - Seated Hip Abduction with Resistance  - 1 x daily - 7 x weekly - 3 sets - 10 reps    PATIENT EDUCATION:  Education details: MOI, diagnosis, prognosis, anatomy, exercise progression, DOMS expectations, muscle firing,  envelope of function, HEP, POC  Person educated: Patient Education method: Explanation, Demonstration, Tactile cues, Verbal cues, and Handouts Education comprehension: verbalized understanding, returned demonstration, verbal cues required, and tactile cues required  HOME EXERCISE PROGRAM:  Access Code: [email or web address] URL: [email or web address] Date: 11/23/2022 Prepared by: Zebedee Iba  ASSESSMENT:  CLINICAL IMPRESSION: Treatment today continued to  focus on overall bil LE strengthening with pain monitored throughout and proper muscle facilitation. Pt would benefit from continued skilled therapy for bil LE strengthening in order to reach goals and maximize functional bilat LE strength and ROM for prevention of further functional decline.    OBJECTIVE IMPAIRMENTS Abnormal gait, decreased activity tolerance, decreased balance, decreased endurance, decreased mobility, difficulty walking, decreased ROM, decreased strength, hypomobility, increased edema, impaired  flexibility, improper body mechanics, postural dysfunction, obesity, and pain.    ACTIVITY LIMITATIONS carrying, lifting, bending, sitting, standing, squatting, and stairs   PARTICIPATION LIMITATIONS: cleaning, laundry, driving, shopping, community activity, occupation, and yard work   PERSONAL FACTORS Time since onset of injury/illness/exacerbation, 3+ comorbidities: obesity, OA, anxiety and plantar fascitis  are also affecting patient's functional outcome.    REHAB POTENTIAL: Good   CLINICAL DECISION MAKING: Low/stable   EVALUATION COMPLEXITY: low     GOALS: SHORT TERM GOALS: Target date: 12/31/2022   Pt will become independent with HEP in order to demonstrate synthesis of PT education.   Goal status: INITIAL goal re-established on 4/25   2.  Pt will score >/= 19 on FOTO to demonstrate improvement in perceived L LE function.  goal re-established on 4/25   Goal status: INITIAL   3.  Pt will report at least 2 pt reduction on NPRS scale for pain during ADL in order to demonstrate functional improvement with household activity, self care, and ADL.  goal re-established on 4/25   Goal status: INITIAL     LONG TERM GOALS: Target date: 02/11/2023     Pt  will become independent with final HEP in order to demonstrate synthesis of PT education.   goal re-established  on 4/25 Goal status: INITIAL   2.  Pt will be able to demonstrate ascending and descending stairs with reciprocal pattern in order to demonstrate functional improvement in LE function for self-care and house hold duties.   goal re-established on 4/25     Goal status: INITIAL   3.  Pt will score >/= 47 on FOTO to demonstrate improvement in perceived bilat Knee function.    Goal status: INITIAL   4.  Pt will be able to demonstrate/report ability to walk > 50 mins  in order to demonstrate functional improvement and tolerance to exercise and community mobility.   Goal status: INITIAL goal re-established on 4/25          PLAN: PT FREQUENCY: 1-2x/week   PT DURATION: 8  weeks   PLANNED INTERVENTIONS: Therapeutic exercises, Therapeutic activity, Neuromuscular re-education, Balance training, Gait training, Patient/Family education, Self Care, Joint mobilization, Joint manipulation, Stair training, Orthotic/Fit training, Aquatic Therapy, Dry Needling, Electrical stimulation, Spinal manipulation, Spinal mobilization, Cryotherapy, Moist heat, Taping, Traction, Biofeedback, Ionotophoresis 4mg /ml Dexamethasone, Manual therapy, and Re-evaluation   PLAN FOR NEXT SESSION: Review set up of exercise bike whenever she plans on returning to the gym. Continue bil LE strengthening for bil knee pain

## 2023-04-27 ENCOUNTER — Ambulatory Visit (HOSPITAL_BASED_OUTPATIENT_CLINIC_OR_DEPARTMENT_OTHER): Payer: BC Managed Care – PPO | Admitting: Physical Therapy

## 2023-04-27 ENCOUNTER — Telehealth (HOSPITAL_BASED_OUTPATIENT_CLINIC_OR_DEPARTMENT_OTHER): Payer: Self-pay | Admitting: Physical Therapy

## 2023-04-27 NOTE — Telephone Encounter (Signed)
Called and left message 2nd to no-show. Patient advised of her next appointment. She was advised of our attendance policy.

## 2023-04-30 DIAGNOSIS — G4733 Obstructive sleep apnea (adult) (pediatric): Secondary | ICD-10-CM | POA: Diagnosis not present

## 2023-04-30 DIAGNOSIS — G4710 Hypersomnia, unspecified: Secondary | ICD-10-CM | POA: Diagnosis not present

## 2023-05-04 ENCOUNTER — Ambulatory Visit (HOSPITAL_BASED_OUTPATIENT_CLINIC_OR_DEPARTMENT_OTHER): Payer: BC Managed Care – PPO

## 2023-05-11 ENCOUNTER — Encounter (HOSPITAL_BASED_OUTPATIENT_CLINIC_OR_DEPARTMENT_OTHER): Payer: Self-pay | Admitting: Rehabilitative and Restorative Service Providers"

## 2023-05-11 ENCOUNTER — Ambulatory Visit (HOSPITAL_BASED_OUTPATIENT_CLINIC_OR_DEPARTMENT_OTHER): Payer: BC Managed Care – PPO | Attending: Student | Admitting: Rehabilitative and Restorative Service Providers"

## 2023-05-11 DIAGNOSIS — G8929 Other chronic pain: Secondary | ICD-10-CM | POA: Diagnosis not present

## 2023-05-11 DIAGNOSIS — M25562 Pain in left knee: Secondary | ICD-10-CM | POA: Diagnosis not present

## 2023-05-11 DIAGNOSIS — M5459 Other low back pain: Secondary | ICD-10-CM | POA: Insufficient documentation

## 2023-05-11 DIAGNOSIS — M25561 Pain in right knee: Secondary | ICD-10-CM | POA: Insufficient documentation

## 2023-05-11 DIAGNOSIS — R262 Difficulty in walking, not elsewhere classified: Secondary | ICD-10-CM | POA: Insufficient documentation

## 2023-05-11 DIAGNOSIS — M6281 Muscle weakness (generalized): Secondary | ICD-10-CM | POA: Insufficient documentation

## 2023-05-11 NOTE — Therapy (Signed)
OUTPATIENT PHYSICAL THERAPY LOWER EXTREMITY EVALUATION   Patient Name: Tracy Mcpherson MRN: [id number] DOB:June 08, 1971, 52 y.o., female Today's Date: 05/11/2023  END OF SESSION:  PT End of Session - 05/11/23 1150     Visit Number 4    Number of Visits 8    Date for PT Re-Evaluation 05/25/23    Authorization Type BCBS    PT Start Time 1135    PT Stop Time 1223    PT Time Calculation (min) 48 min    Activity Tolerance Patient tolerated treatment well;No increased pain    Behavior During Therapy WFL for tasks assessed/performed             Past Medical History:  Diagnosis Date   Anemia    Anxiety    Asthma    Constipation    Epilepsy (HCC)    Fibroids    History of gestational diabetes    History of herpes simplex type 2 infection    History of pregnancy induced hypertension    Hx of leukocytosis    mild   Iron deficiency anemia    Joint pain    Lower extremity edema    Migraines    Morbid obesity (HCC)    Pain in both lower legs    Plantar fasciitis    Prediabetes    Right knee pain    Seasonal allergies    Seizures (HCC)    Sleep apnea    Vitamin B 12 deficiency    Vitamin D deficiency    Past Surgical History:  Procedure Laterality Date   CESAREAN SECTION  11-02-2007   CRANIOTOMY FOR TEMPORAL LOBECTOMY     GYNECOLOGIC CRYOSURGERY     IUD REMOVAL  12-16-2009   REMOVAL THYROID CYST  2000   Patient Active Problem List   Diagnosis Date Noted   Alopecia 05/31/2022   Postmenopausal bleeding 05/31/2022   Asthma without status asthmaticus 05/31/2022   Gastroesophageal reflux disease 05/31/2022   Common migraine 05/31/2022   Sacroiliac joint dysfunction of right side 11/10/2021   Plantar fasciitis, bilateral 11/10/2021   Patellofemoral pain syndrome of both knees 08/21/2021   Vitamin D deficiency 05/12/2020   Prediabetes 05/12/2020   Class 3 severe obesity with serious comorbidity and body mass index (BMI) of 50.0 to 59.9 in adult (HCC) 05/12/2020    Unspecified deficiency anemia 02/20/2012   Seizure disorder (HCC) 02/20/2012   Leucocytosis 02/20/2012    PCP: Mila Palmer, MD   REFERRING PROVIDER:   Huel Cote, MD    REFERRING DIAG:  Diagnosis  M25.561,G89.29 (ICD-10-CM) - Chronic pain of right knee  M25.562,G89.29 (ICD-10-CM) - Chronic pain of left knee    THERAPY DIAG:  Chronic pain of left knee  Chronic pain of right knee  Muscle weakness (generalized)  Difficulty in walking, not elsewhere classified  Other low back pain  Rationale for Evaluation and Treatment: Rehabilitation  ONSET DATE: 10/23  SUBJECTIVE:   SUBJECTIVE STATEMENT:  Pain is better. No [employer]. Not going to do that now; going to do home exercises for now only. Have access to a pool     PERTINENT HISTORY: Asthma, hx of seizures (none since 2013), sleep apnea, anemia, anxiety, prediabetes, plantar fascitis.   PAIN:  Are you having pain? Yes: NPRS scale: 3/10 Pain location: R knee medially; L knee lateral and posteriorly Pain description: Burning, sharp, tight Aggravating factors: sitting> 30 minutes, ascending/descending stairs, getting out of a chair, standing > 15 minutes Relieving factors:  pain medication, heating  pad, movement/exercise    PRECAUTIONS: None   WEIGHT BEARING RESTRICTIONS No   FALLS:  Has patient fallen in last 6 months? No   LIVING ENVIRONMENT: Lives with: lives with their family Lives in: House/apartment Stairs: No Has following equipment at home: None   OCCUPATION: Hotel manager; does travel for work    Hobbies:  Wellsite geologist to walk  [employer] to [employer]    PLOF: Independent   PATIENT GOALS: able to do things with her son; be able to go on vacation     OBJECTIVE:   DIAGNOSTIC FINDINGS:  R knee x ray  IMPRESSION: Moderate tricompartmental osteoarthritis, most prominent in the medial tibiofemoral compartment.  L knee xray  IMPRESSION: Tricompartmental osteoarthritis,  severe in the medial tibiofemoral compartment. Small joint effusion.    PATIENT SURVEYS:  FOTO 31  55 @ DC  19 pts MCII   COGNITION: Overall cognitive status: Within functional limits for tasks assessed     SENSATION: WFL   MUSCLE LENGTH: Positive Ely's Positive HS 90/90  POSTURE: No Significant postural limitations  PALPATION: TTP bilat knee joint lines; medial on R; more lateral pain on L; patellar tendon sensitivity ( potentially more sensitive on medial right knee on re-assessment)   LOWER EXTREMITY ROM: moderately limited at bilateral knees due to joint stiffness, pain, and body habitus; hip ROM deferred today   LOWER EXTREMITY MMT:  MMT Right eval Left eval  Hip flexion 27.9 27.8  Hip extension    Hip abduction 15.9 20.1  Hip adduction    Hip internal rotation    Hip external rotation    Knee flexion    Knee extension 23.5 16.0   (Blank rows = not tested)  Pain with end range active and passive flexion   FUNCTIONAL TESTS:    GAIT: Distance walked: 223ft Assistive device utilized: None Level of assistance: Complete Independence Comments: bilat Trendelenberg; progressively worsens with duration of walking ( same on 2nd eval 5/25)    TODAY'S TREATMENT:                                                                                                                              DATE:   [facility] Adult PT Treatment:                                                DATE: 05/11/23 Therapeutic Exercise: Sidelying clam shell x 20 Sidelying combo: hip flexion/hip abduction/hip ext x 20 in ER Ball squeeze with bridge x 20 Prone 5 lb hamstring curl unilaterally bil x 20 each Prone hip ext without weight with glute set x 20 each but with increased difficulty on L LE due to weakness Prone donkey kick x 20 with glute set Prone heel squeeze x 20 with 2-3 sec hold Standing lunge limited AROM at  sink countertop x 10 each side Standing calf raise at countertop x  20 Standing diagonal hip abdct/ext combo x 20 at countertop New HEP issued   [facility] Adult PT Treatment:                                                DATE: 04/20/23 Therapeutic Exercise: Gait in the hallway as warmup x 2 min with PT looking for gait abnormalities Quad sets x 20 SLR x 20 VMO SLR x 20 Ball squeeze x 20 Ball squeeze with bridge x 20 Iso ball squeeze with bridge with bil shoulder flex/ext x 20 SAQ 2.5lbs x 20 SAQ 2.5lbs bil with ball squeeze x 20 HS digs x 20 with 2-3 sec hold Sidelying hip abdct x 20 2 lbs Hamstring stretch seated x 30 sec Butterfly stretch 2x30 sec All therex performed bil   [facility] Adult PT Treatment:                                                DATE: 04/06/23 Therapeutic Exercise: Quad set x 15 SLR x 15 VMO SLR x 15 S/L hip abduction x 15 Ball squeeze x 15 Ball squeeze with bridge x 15 SAQ 2 lbs unilat/bil x 15 each Seated Hamstring stretch 1 x 30 sec  Seated hip abduction with GTB x 10 Answered questions regarding [employer] equipment All therex performed bil   1/19  Access Code: [email or web address] URL: [email or web address] Date: 03/30/2023 Prepared by: Lorayne Bender  Exercises - Seated Hamstring Stretch  - 1 x daily - 7 x weekly - 3 sets - 3 reps - 20sec  hold - Supine Quad Set  - 3-4 x daily - 7 x weekly - 3 sets - 10 reps - 5 sec  hold - Active Straight Leg Raise with Quad Set  - 1 x daily - 7 x weekly - 3 sets - 10 reps - Seated Hip Abduction with Resistance  - 1 x daily - 7 x weekly - 3 sets - 10 reps  05/11/23 Access Code: [email or web address] URL: [email or web address] Date: 05/11/2023 Prepared by: Luna Fuse  Exercises - Sitting Knee Extension with Resistance  - 1 x daily - 7 x weekly - 3 sets - 10 reps - Seated Hamstring Curl with Anchored Resistance  - 1 x daily - 7 x weekly - 3 sets - 10 reps - Sit to Stand with Arms Crossed  - 1 x daily - 7 x weekly - 4 sets - 5 reps - Standing March with Counter  Support  - 1 x daily - 3 x weekly - 1 sets - 10 reps - Standing Knee Flexion AROM with Chair Support  - 1 x daily - 3 x weekly - 1 sets - 10 reps - Standing Hip Extension with Counter Support  - 1 x daily - 3 x weekly - 1 sets - 10 reps - Sidestepping  - 1 x daily - 3 x weekly - 3 sets - 10 reps   PATIENT EDUCATION:  Education details: MOI, diagnosis, prognosis, anatomy, exercise progression, DOMS expectations, muscle firing,  envelope of function, HEP, POC  Person educated: Patient Education method: Explanation, Demonstration, Tactile cues, Verbal cues, and Handouts Education comprehension: verbalized understanding,  returned demonstration, verbal cues required, and tactile cues required  HOME EXERCISE PROGRAM:  Access Code: [email or web address] URL: [email or web address] Date: 05/11/23 Prepared ZO:XWRUE Christell Constant  ASSESSMENT:  CLINICAL IMPRESSION: Treatment today continued to  focus on overall bil LE strengthening with pain monitored throughout and proper muscle facilitation. Extra focus given to hip extensors and posterior chain muscles due to pt reports she thinks her knees are popping into hyperextension. Pt would benefit from continued skilled therapy for bil LE strengthening in order to reach goals and maximize functional bilat LE strength and ROM for prevention of further functional decline. Pt stated she does have access to a pool; pool therex given (last 4 on HEP)   OBJECTIVE IMPAIRMENTS Abnormal gait, decreased activity tolerance, decreased balance, decreased endurance, decreased mobility, difficulty walking, decreased ROM, decreased strength, hypomobility, increased edema, impaired flexibility, improper body mechanics, postural dysfunction, obesity, and pain.    ACTIVITY LIMITATIONS carrying, lifting, bending, sitting, standing, squatting, and stairs   PARTICIPATION LIMITATIONS: cleaning, laundry, driving, shopping, community activity, occupation, and yard work   PERSONAL  FACTORS Time since onset of injury/illness/exacerbation, 3+ comorbidities: obesity, OA, anxiety and plantar fascitis  are also affecting patient's functional outcome.    REHAB POTENTIAL: Good   CLINICAL DECISION MAKING: Low/stable   EVALUATION COMPLEXITY: low     GOALS: SHORT TERM GOALS: Target date: 12/31/2022   Pt will become independent with HEP in order to demonstrate synthesis of PT education.   Goal status: INITIAL goal re-established on 4/25   2.  Pt will score >/= 19 on FOTO to demonstrate improvement in perceived L LE function.  goal re-established on 4/25   Goal status: INITIAL   3.  Pt will report at least 2 pt reduction on NPRS scale for pain during ADL in order to demonstrate functional improvement with household activity, self care, and ADL.  goal re-established on 4/25   Goal status: INITIAL     LONG TERM GOALS: Target date: 02/11/2023     Pt  will become independent with final HEP in order to demonstrate synthesis of PT education.   goal re-established on 4/25 Goal status: INITIAL   2.  Pt will be able to demonstrate ascending and descending stairs with reciprocal pattern in order to demonstrate functional improvement in LE function for self-care and house hold duties.   goal re-established on 4/25     Goal status: INITIAL   3.  Pt will score >/= 47 on FOTO to demonstrate improvement in perceived bilat Knee function.    Goal status: INITIAL   4.  Pt will be able to demonstrate/report ability to walk > 50 mins  in order to demonstrate functional improvement and tolerance to exercise and community mobility.   Goal status: INITIAL goal re-established on 4/25         PLAN: PT FREQUENCY: 1-2x/week   PT DURATION: 8  weeks   PLANNED INTERVENTIONS: Therapeutic exercises, Therapeutic activity, Neuromuscular re-education, Balance training, Gait training, Patient/Family education, Self Care, Joint mobilization, Joint manipulation, Stair training, Orthotic/Fit  training, Aquatic Therapy, Dry Needling, Electrical stimulation, Spinal manipulation, Spinal mobilization, Cryotherapy, Moist heat, Taping, Traction, Biofeedback, Ionotophoresis 4mg /ml Dexamethasone, Manual therapy, and Re-evaluation   PLAN FOR NEXT SESSION: which way is pt's knee popping, did she get in the pool? How did she tolerate tx progression today    [facility] Outpatient Rehabilitation at [facility] [address] [place], [place], [address] Phone: [phone]   Fax:  [phone]  Patient Details  Name: Tracy  MIQUELA Mcpherson MRN: [id number] Date of Birth: 1971-01-29 Referring Provider:  Amador Cunas, [place]*  Encounter Date: 05/11/2023   Luna Fuse, PT 05/11/2023, 12:27 PM  [facility] Outpatient Rehabilitation at [facility] [address] [place], [place], [address] Phone: [phone]   Fax:  [phone]

## 2023-05-12 ENCOUNTER — Other Ambulatory Visit (HOSPITAL_BASED_OUTPATIENT_CLINIC_OR_DEPARTMENT_OTHER): Payer: Self-pay | Admitting: Orthopaedic Surgery

## 2023-05-15 DIAGNOSIS — J309 Allergic rhinitis, unspecified: Secondary | ICD-10-CM | POA: Diagnosis not present

## 2023-05-15 DIAGNOSIS — F419 Anxiety disorder, unspecified: Secondary | ICD-10-CM | POA: Diagnosis not present

## 2023-05-15 DIAGNOSIS — J4540 Moderate persistent asthma, uncomplicated: Secondary | ICD-10-CM | POA: Diagnosis not present

## 2023-05-18 ENCOUNTER — Encounter (HOSPITAL_BASED_OUTPATIENT_CLINIC_OR_DEPARTMENT_OTHER): Payer: Self-pay | Admitting: Rehabilitative and Restorative Service Providers"

## 2023-05-18 ENCOUNTER — Ambulatory Visit (HOSPITAL_BASED_OUTPATIENT_CLINIC_OR_DEPARTMENT_OTHER): Payer: BC Managed Care – PPO | Admitting: Rehabilitative and Restorative Service Providers"

## 2023-05-18 DIAGNOSIS — M25562 Pain in left knee: Secondary | ICD-10-CM | POA: Diagnosis not present

## 2023-05-18 DIAGNOSIS — G8929 Other chronic pain: Secondary | ICD-10-CM | POA: Diagnosis not present

## 2023-05-18 DIAGNOSIS — R262 Difficulty in walking, not elsewhere classified: Secondary | ICD-10-CM

## 2023-05-18 DIAGNOSIS — M6281 Muscle weakness (generalized): Secondary | ICD-10-CM

## 2023-05-18 DIAGNOSIS — M25561 Pain in right knee: Secondary | ICD-10-CM | POA: Diagnosis not present

## 2023-05-18 DIAGNOSIS — M5459 Other low back pain: Secondary | ICD-10-CM | POA: Diagnosis not present

## 2023-05-18 NOTE — Therapy (Signed)
OUTPATIENT PHYSICAL THERAPY LOWER EXTREMITY TREATMENT/RECERT   Patient Name: Tracy Mcpherson MRN: [id number] DOB:1971-08-01, 52 y.o., female Today's Date: 05/18/2023  END OF SESSION:  PT End of Session - 05/18/23 1215     Visit Number 5    Number of Visits 14    Date for PT Re-Evaluation 06/29/23    Authorization Type [employer]    PT Start Time 1138    PT Stop Time 1236    PT Time Calculation (min) 58 min    Activity Tolerance Patient tolerated treatment well;Patient limited by pain    Behavior During Therapy WFL for tasks assessed/performed              Past Medical History:  Diagnosis Date   Anemia    Anxiety    Asthma    Constipation    Epilepsy (HCC)    Fibroids    History of gestational diabetes    History of herpes simplex type 2 infection    History of pregnancy induced hypertension    Hx of leukocytosis    mild   Iron deficiency anemia    Joint pain    Lower extremity edema    Migraines    Morbid obesity (HCC)    Pain in both lower legs    Plantar fasciitis    Prediabetes    Right knee pain    Seasonal allergies    Seizures (HCC)    Sleep apnea    Vitamin B 12 deficiency    Vitamin D deficiency    Past Surgical History:  Procedure Laterality Date   CESAREAN SECTION  11-02-2007   CRANIOTOMY FOR TEMPORAL LOBECTOMY     GYNECOLOGIC CRYOSURGERY     IUD REMOVAL  12-16-2009   REMOVAL THYROID CYST  2000   Patient Active Problem List   Diagnosis Date Noted   Alopecia 05/31/2022   Postmenopausal bleeding 05/31/2022   Asthma without status asthmaticus 05/31/2022   Gastroesophageal reflux disease 05/31/2022   Common migraine 05/31/2022   Sacroiliac joint dysfunction of right side 11/10/2021   Plantar fasciitis, bilateral 11/10/2021   Patellofemoral pain syndrome of both knees 08/21/2021   Vitamin D deficiency 05/12/2020   Prediabetes 05/12/2020   Class 3 severe obesity with serious comorbidity and body mass index (BMI) of 50.0 to 59.9 in adult (HCC)  05/12/2020   Unspecified deficiency anemia 02/20/2012   Seizure disorder (HCC) 02/20/2012   Leucocytosis 02/20/2012    PCP: Mila Palmer, MD   REFERRING PROVIDER:   Huel Cote, MD    REFERRING DIAG:  Diagnosis  M25.561,G89.29 (ICD-10-CM) - Chronic pain of right knee  M25.562,G89.29 (ICD-10-CM) - Chronic pain of left knee    THERAPY DIAG:  Chronic pain of left knee  Chronic pain of right knee  Muscle weakness (generalized)  Difficulty in walking, not elsewhere classified  Rationale for Evaluation and Treatment: Rehabilitation  ONSET DATE: 10/23  SUBJECTIVE:   SUBJECTIVE STATEMENT: No popping this week. Sat/Sun/Mon exercise; Tue began the pain; Wed I was sitting more at my desk at work/Thu and Fri training. Sat 3 AM worse swelling. I wear tights made of spandex, 1 size smaller to work with socks under my pants.      PERTINENT HISTORY: Asthma, hx of seizures (none since 2013), sleep apnea, anemia, anxiety, prediabetes, plantar fascitis.   PAIN:  Are you having pain? Yes: NPRS scale: 7/10 Pain location: R knee medially; L knee lateral and posteriorly Pain description: Burning, sharp, tight Aggravating factors: sitting> 30 minutes, ascending/descending  stairs, getting out of a chair, standing > 15 minutes Relieving factors:  pain medication, heating pad, movement/exercise    PRECAUTIONS: None   WEIGHT BEARING RESTRICTIONS No   FALLS:  Has patient fallen in last 6 months? No   LIVING ENVIRONMENT: Lives with: lives with their family Lives in: House/apartment Stairs: No Has following equipment at home: None   OCCUPATION: Hotel manager; does travel for work    Hobbies:  Wellsite geologist to walk  [employer] to [employer]    PLOF: Independent   PATIENT GOALS: able to do things with her son; be able to go on vacation     OBJECTIVE:   DIAGNOSTIC FINDINGS:  R knee x ray  IMPRESSION: Moderate tricompartmental osteoarthritis, most prominent in  the medial tibiofemoral compartment.  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 Small joint effusion.    PATIENT SURVEYS:  FOTO 31  55 @ DC  19 pts MCII   COGNITION: Overall cognitive status: Within functional limits for tasks assessed     SENSATION: WFL   MUSCLE LENGTH: Positive Ely's Positive HS 90/90  POSTURE: No Significant postural limitations  PALPATION: TTP bilat knee joint lines; medial on R; more lateral pain on L; patellar tendon sensitivity ( potentially more sensitive on medial right knee on re-assessment)   LOWER EXTREMITY ROM: moderately limited at bilateral knees due to joint stiffness, pain, and body habitus; hip ROM deferred today   LOWER EXTREMITY MMT: SECOND MEASUREMENT RECERT MEASUREMENT 05/18/23  MMT Right eval Left eval  Hip flexion 27.9/20.3 27.8/22.9  Hip extension    Hip abduction 15.9/16.4 20.1/16.0  Hip adduction    Hip internal rotation    Hip external rotation    Knee flexion    Knee extension 23.5/ 24.2 16.0/20.7   (Blank rows = not tested)  Pain with end range active and passive flexion   FUNCTIONAL TESTS:    GAIT: Distance walked: 252ft Assistive device utilized: None Level of assistance: Complete Independence Comments: bilat Trendelenberg; progressively worsens with duration of walking ( same on 2nd eval 5/25)    TODAY'S TREATMENT:                                                                                                                              DATE:   [facility] Adult PT Treatment:                                                DATE: 05/18/23 Therapeutic Exercise: Quad sets x 15 R SLR x 15 R Sidelying R clam shell x 20 with 2-3 sec hold Sidelying R hip abduction x 20 with 2-3 sec hold Sidelying combo: hip flexion/hip abduction/hip ext x 20 in ER R Prone 5 lb hamstring curl unilaterally bil x 20 each Prone hip  ext without weight with glute set x  20 each LE Donkey kick x 20 each LE All with PT palpation for proper muscle contraction and modifications as needed for discomfort or technique  Therapeutic Activity: Discussed concerns over swelling; discussed benefits of thigh TED hose for swelling bil; recommended pt return to MD; issued directions for heating/icing as pt's bil lower extremities are swollen, especially superior to patella and around sock ribbing. Advised pt to go without socks if she doesn't have socks that more comfortably fit vs socks that increase swelling.Discussed water exercises beginning as pt has not performed water therex yet.   [facility] Adult PT Treatment:                                                DATE: 05/11/23 Therapeutic Exercise: Sidelying clam shell x 20 Sidelying combo: hip flexion/hip abduction/hip ext x 20 in ER Ball squeeze with bridge x 20 Prone 5 lb hamstring curl unilaterally bil x 20 each Prone hip ext without weight with glute set x 20 each but with increased difficulty on L LE due to weakness Prone donkey kick x 20 with glute set Prone heel squeeze x 20 with 2-3 sec hold Standing lunge limited AROM at sink countertop x 10 each side Standing calf raise at countertop x 20 Standing diagonal hip abdct/ext combo x 20 at countertop New HEP issued   [facility] Adult PT Treatment:                                                DATE: 04/20/23 Therapeutic Exercise: Gait in the hallway as warmup x 2 min with PT looking for gait abnormalities Quad sets x 20 SLR x 20 VMO SLR x 20 Ball squeeze x 20 Ball squeeze with bridge x 20 Iso ball squeeze with bridge with bil shoulder flex/ext x 20 SAQ 2.5lbs x 20 SAQ 2.5lbs bil with ball squeeze x 20 HS digs x 20 with 2-3 sec hold Sidelying hip abdct x 20 2 lbs Hamstring stretch seated x 30 sec Butterfly stretch 2x30 sec All therex performed bil   [facility] Adult PT Treatment:                                                DATE: 04/06/23 Therapeutic Exercise: Quad set  x 15 SLR x 15 VMO SLR x 15 S/L hip abduction x 15 Ball squeeze x 15 Ball squeeze with bridge x 15 SAQ 2 lbs unilat/bil x 15 each Seated Hamstring stretch 1 x 30 sec  Seated hip abduction with GTB x 10 Answered questions regarding [employer] gym equipment All therex performed bil   1/19  Access Code: [email or web address] URL: [email or web address] Date: 03/30/2023 Prepared by: Lorayne Bender  Exercises - Seated Hamstring Stretch  - 1 x daily - 7 x weekly - 3 sets - 3 reps - 20sec  hold - Supine Quad Set  - 3-4 x daily - 7 x weekly - 3 sets - 10 reps - 5 sec  hold - Active Straight Leg Raise with Quad Set  - 1 x daily -  7 x weekly - 3 sets - 10 reps - Seated Hip Abduction with Resistance  - 1 x daily - 7 x weekly - 3 sets - 10 reps  05/11/23 Access Code: [email or web address] URL: [email or web address] Date: 05/11/2023 Prepared by: Luna Fuse  Exercises - Sitting Knee Extension with Resistance  - 1 x daily - 7 x weekly - 3 sets - 10 reps - Seated Hamstring Curl with Anchored Resistance  - 1 x daily - 7 x weekly - 3 sets - 10 reps - Sit to Stand with Arms Crossed  - 1 x daily - 7 x weekly - 4 sets - 5 reps - Standing March with Counter Support  - 1 x daily - 3 x weekly - 1 sets - 10 reps - Standing Knee Flexion AROM with Chair Support  - 1 x daily - 3 x weekly - 1 sets - 10 reps - Standing Hip Extension with Counter Support  - 1 x daily - 3 x weekly - 1 sets - 10 reps - Sidestepping  - 1 x daily - 3 x weekly - 3 sets - 10 reps   PATIENT EDUCATION:  Education details: MOI, diagnosis, prognosis, anatomy, exercise progression, DOMS expectations, muscle firing,  envelope of function, HEP, POC  Person educated: Patient Education method: Explanation, Demonstration, Tactile cues, Verbal cues, and Handouts Education comprehension: verbalized understanding, returned demonstration, verbal cues required, and tactile cues required  HOME EXERCISE PROGRAM:  Access Code:  [email or web address] URL: [email or web address] Date: 05/11/23 Prepared ZO:XWRUE Christell Constant  ASSESSMENT:  CLINICAL IMPRESSION: Treatment today continued to  focus on overall bil LE strengthening with pain monitored throughout and proper muscle facilitation. Extra focus given to hip extensors and posterior chain muscles due to pt reports she thinks her knees are popping into hyperextension. Pt reports bil knee popping to have improved since last visit but swelling in her leg overall to have increased since last week after she performed increased activity one day and then having to sit for work extra. Difficult getting out of chair at work. PT encouraged pt to return to MD for further assessment of pain and swelling over this next week. Pt would benefit from continued skilled therapy for bil LE strengthening in order to reach goals and maximize functional bilat LE strength and ROM for prevention of further functional decline. Pt stated she does have access to a pool; pool therex given (last 4 on HEP)   OBJECTIVE IMPAIRMENTS Abnormal gait, decreased activity tolerance, decreased balance, decreased endurance, decreased mobility, difficulty walking, decreased ROM, decreased strength, hypomobility, increased edema, impaired flexibility, improper body mechanics, postural dysfunction, obesity, and pain.    ACTIVITY LIMITATIONS carrying, lifting, bending, sitting, standing, squatting, and stairs   PARTICIPATION LIMITATIONS: cleaning, laundry, driving, shopping, community activity, occupation, and yard work   PERSONAL FACTORS Time since onset of injury/illness/exacerbation, 3+ comorbidities: obesity, OA, anxiety and plantar fascitis  are also affecting patient's functional outcome.    REHAB POTENTIAL: Good   CLINICAL DECISION MAKING: Low/stable   EVALUATION COMPLEXITY: low     GOALS: SHORT TERM GOALS: Target date: 06/15/23   Pt will become independent with HEP in order to demonstrate synthesis of PT  education.   Goal status: INITIAL goal re-established on 4/25 Goal 05/18/23: MET   2.  Pt will score >/= 19 on FOTO to demonstrate improvement in perceived L LE function.  goal re-established on 4/25   Goal status: INITIAL Goal 05/18/23: Ongoing   3.  Pt will report at least 2 pt reduction  on NPRS scale for pain during ADL in order to demonstrate functional improvement with household activity, self care, and ADL.  goal re-established on 4/25   Goal status: INITIAL Goal 05/18/23: 7/10    LONG TERM GOALS: Target date: 06/29/23     Pt  will become independent with final HEP in order to demonstrate synthesis of PT education.   goal re-established on 4/25 Goal status 05/18/23: MET   2.  Pt will be able to demonstrate ascending and descending stairs with reciprocal pattern in order to demonstrate functional improvement in LE function for self-care and house hold duties.   goal re-established on 4/25     Goal status: INITIAL Goal 05/18/23: Unmet; Continue   3.  Pt will score >/= 47 on FOTO to demonstrate improvement in perceived bilat Knee function.    Goal status: INITIAL Goal 05/18/23: unmet; continue   4.  Pt will be able to demonstrate/report ability to walk > 50 mins  in order to demonstrate functional improvement and tolerance to exercise and community mobility.   Goal status: INITIAL goal re-established on 4/25 Goal 05/18/23: MET  5. Pt will be able to transfer sit to stand at work after sitting x 1 hour with pain </= 5/10  Initial 05/18/23: 9/10         PLAN: PT FREQUENCY: 1-2x/week   PT DURATION: 6 weeks   PLANNED INTERVENTIONS: Therapeutic exercises, Therapeutic activity, Neuromuscular re-education, Balance training, Gait training, Patient/Family education, Self Care, Joint mobilization, Joint manipulation, Stair training, Orthotic/Fit training, Aquatic Therapy, Dry Needling, Electrical stimulation, Spinal manipulation, Spinal mobilization, Cryotherapy, Moist heat,  Taping, Traction, Biofeedback, Ionotophoresis 4mg /ml Dexamethasone, Manual therapy, and Re-evaluation   PLAN FOR NEXT SESSION: Did pt began water therapy at her pool; assess knee popping and R medial knee burning; continue posterior chain strengthening, McConnell taping? Did she get appt with Dr. Vincent [facility] at [facility] [address] [place], [place], [address] Phone: [phone]   Fax:  [phone]  Patient Details  Name: FIZA ROETMAN MRN: [id number] Date of Birth: April 09, 1971 Referring Provider:  Amador Cunas, [place]*  Encounter Date: 05/18/2023   Luna Fuse, PT 05/18/2023, 12:53 PM  [facility] at [facility] [address] [place], [place], [address] Phone: [phone]   Fax:  [phone]

## 2023-05-19 ENCOUNTER — Emergency Department (HOSPITAL_BASED_OUTPATIENT_CLINIC_OR_DEPARTMENT_OTHER)
Admission: EM | Admit: 2023-05-19 | Discharge: 2023-05-19 | Disposition: A | Discharge status: Home / Self Care | Payer: BC Managed Care – PPO | Attending: Emergency Medicine | Admitting: Emergency Medicine

## 2023-05-19 ENCOUNTER — Other Ambulatory Visit: Payer: Self-pay

## 2023-05-19 ENCOUNTER — Encounter: Payer: Self-pay | Admitting: Hematology and Oncology

## 2023-05-19 ENCOUNTER — Emergency Department (HOSPITAL_BASED_OUTPATIENT_CLINIC_OR_DEPARTMENT_OTHER): Payer: BC Managed Care – PPO

## 2023-05-19 ENCOUNTER — Emergency Department (HOSPITAL_BASED_OUTPATIENT_CLINIC_OR_DEPARTMENT_OTHER): Payer: BC Managed Care – PPO | Admitting: Radiology

## 2023-05-19 ENCOUNTER — Encounter (HOSPITAL_BASED_OUTPATIENT_CLINIC_OR_DEPARTMENT_OTHER): Payer: Self-pay

## 2023-05-19 DIAGNOSIS — R0602 Shortness of breath: Secondary | ICD-10-CM | POA: Diagnosis not present

## 2023-05-19 DIAGNOSIS — R079 Chest pain, unspecified: Secondary | ICD-10-CM | POA: Diagnosis not present

## 2023-05-19 DIAGNOSIS — R2243 Localized swelling, mass and lump, lower limb, bilateral: Secondary | ICD-10-CM | POA: Diagnosis not present

## 2023-05-19 DIAGNOSIS — R0789 Other chest pain: Secondary | ICD-10-CM | POA: Diagnosis not present

## 2023-05-19 DIAGNOSIS — K219 Gastro-esophageal reflux disease without esophagitis: Secondary | ICD-10-CM | POA: Diagnosis not present

## 2023-05-19 DIAGNOSIS — M545 Low back pain, unspecified: Secondary | ICD-10-CM | POA: Insufficient documentation

## 2023-05-19 DIAGNOSIS — K2100 Gastro-esophageal reflux disease with esophagitis, without bleeding: Secondary | ICD-10-CM | POA: Diagnosis not present

## 2023-05-19 DIAGNOSIS — M7989 Other specified soft tissue disorders: Secondary | ICD-10-CM | POA: Diagnosis not present

## 2023-05-19 DIAGNOSIS — R7989 Other specified abnormal findings of blood chemistry: Secondary | ICD-10-CM | POA: Diagnosis not present

## 2023-05-19 LAB — CBC
HCT: 34.3 % — ABNORMAL LOW (ref 36.0–46.0)
Hemoglobin: 10.7 g/dL — ABNORMAL LOW (ref 12.0–15.0)
MCH: 23.6 pg — ABNORMAL LOW (ref 26.0–34.0)
MCHC: 31.2 g/dL (ref 30.0–36.0)
MCV: 75.6 fL — ABNORMAL LOW (ref 80.0–100.0)
Platelets: 414 10*3/uL — ABNORMAL HIGH (ref 150–400)
RBC: 4.54 MIL/uL (ref 3.87–5.11)
RDW: 16.9 % — ABNORMAL HIGH (ref 11.5–15.5)
WBC: 12.2 10*3/uL — ABNORMAL HIGH (ref 4.0–10.5)
nRBC: 0 % (ref 0.0–0.2)

## 2023-05-19 LAB — BASIC METABOLIC PANEL
Anion gap: 9 (ref 5–15)
BUN: 13 mg/dL (ref 6–20)
CO2: 26 mmol/L (ref 22–32)
Calcium: 9.9 mg/dL (ref 8.9–10.3)
Chloride: 104 mmol/L (ref 98–111)
Creatinine, Ser: 0.99 mg/dL (ref 0.44–1.00)
GFR, Estimated: >60 mL/min (ref >60)
Glucose, Bld: 113 mg/dL — ABNORMAL HIGH (ref 70–99)
Potassium: 4.3 mmol/L (ref 3.5–5.1)
Sodium: 139 mmol/L (ref 135–145)

## 2023-05-19 LAB — D-DIMER, QUANTITATIVE: D-Dimer, Quant: 0.56 ug/mL-FEU — ABNORMAL HIGH (ref 0.00–0.50)

## 2023-05-19 LAB — TROPONIN I (HIGH SENSITIVITY): Troponin I (High Sensitivity): 2 ng/L (ref <18)

## 2023-05-19 MED ORDER — FAMOTIDINE 20 MG PO TABS: 20.0000 mg | ORAL_TABLET | Freq: Two times a day (BID) | ORAL | 0 refills | Status: AC

## 2023-05-19 MED ORDER — ONDANSETRON HCL 4 MG/2ML IJ SOLN
4.0000 mg | Freq: Once | INTRAMUSCULAR | Status: AC
  Administered 2023-05-19: 4 mg via INTRAVENOUS
  Filled 2023-05-19: qty 2

## 2023-05-19 MED ORDER — LIDOCAINE VISCOUS HCL 2 % MT SOLN
15.0000 mL | Freq: Once | OROMUCOSAL | Status: AC
  Administered 2023-05-19: 15 mL via ORAL
  Filled 2023-05-19: qty 15

## 2023-05-19 MED ORDER — IOHEXOL 350 MG/ML SOLN
100.0000 mL | Freq: Once | INTRAVENOUS | Status: AC | PRN
  Administered 2023-05-19: 85 mL via INTRAVENOUS

## 2023-05-19 MED ORDER — DIPHENHYDRAMINE HCL 50 MG/ML IJ SOLN
25.0000 mg | Freq: Once | INTRAMUSCULAR | Status: DC
  Filled 2023-05-19: qty 1

## 2023-05-19 MED ORDER — PANTOPRAZOLE SODIUM 40 MG PO TBEC: 40.0000 mg | DELAYED_RELEASE_TABLET | Freq: Every day | ORAL | 0 refills | Status: AC

## 2023-05-19 MED ORDER — ALUM & MAG HYDROXIDE-SIMETH 200-200-20 MG/5ML PO SUSP
30.0000 mL | Freq: Once | ORAL | Status: AC
  Administered 2023-05-19: 30 mL via ORAL
  Filled 2023-05-19: qty 30

## 2023-05-19 MED ORDER — FAMOTIDINE 20 MG PO TABS
20.0000 mg | ORAL_TABLET | Freq: Once | ORAL | Status: AC
  Administered 2023-05-19: 20 mg via ORAL
  Filled 2023-05-19: qty 1

## 2023-05-19 NOTE — ED Triage Notes (Signed)
Patient reports chest pain x1 week. Pain has been radiating to her left arm and back.   Rates pain an 8/10.

## 2023-05-19 NOTE — ED Notes (Signed)
Patient back from CT at this time. CT tech notified this nurse of patients increased chest pain and vomiting after CT Scan. Dr. Karene Fry notified and to bedside assessing patient.  Patient given Zofran. Declined to take IV benadryl due to her having to drive.

## 2023-05-19 NOTE — ED Provider Notes (Signed)
[place] EMERGENCY DEPARTMENT AT [facility] Provider Note   CSN: [id number] Arrival date & time: 05/19/23  1035     History  Chief Complaint  Patient presents with   Chest Pain   Back Pain    Tracy Mcpherson is a 52 y.o. female.   Chest Pain Associated symptoms: back pain   Back Pain Associated symptoms: chest pain      52 year old female presenting to the emergency department with a chief complaint of chest pain for the past week.  The pain has been sharp, substernal, radiating to Tracy Mcpherson back and left arm.  The pain has been intermittent, burning in component and intermittently sharp.  Feels different from previous episodes of reflux.  Tracy Mcpherson endorses mild shortness of breath, denies any fever, chills.  No cough.  Tracy Mcpherson states that Tracy Mcpherson has had some asymmetric leg swelling, has a history of lymphedema.  Home Medications Prior to Admission medications   Medication Sig Start Date End Date Taking? Authorizing Provider  famotidine (PEPCID) 20 MG tablet Take 1 tablet (20 mg total) by mouth 2 (two) times daily. 05/19/23 06/18/23 Yes Ernie Avena, MD  pantoprazole (PROTONIX) 40 MG tablet Take 1 tablet (40 mg total) by mouth daily. 05/19/23 06/18/23 Yes Ernie Avena, MD  albuterol (VENTOLIN HFA) 108 (90 Base) MCG/ACT inhaler Inhale into the lungs. 12/29/21   [provider]  ASHWAGANDHA PO Take by mouth daily. Patient not taking: Reported on 03/30/2023    [provider]  Azelastine HCl 137 MCG/SPRAY SOLN Place 1 spray into both nostrils 2 (two) times daily. 03/12/22   [provider]  diclofenac (VOLTAREN) 75 MG EC tablet Take 1 tablet (75 mg total) by mouth 2 (two) times daily as needed. 06/22/22   Myra Rude, MD  diclofenac (VOLTAREN) 75 MG EC tablet Take 1 tablet (75 mg total) by mouth 2 (two) times daily. 02/18/23   Huel Cote, MD  diclofenac (VOLTAREN) 75 MG EC tablet TAKE 1 TABLET BY MOUTH TWICE A DAY 05/13/23   Huel Cote, MD   fluocinonide ointment (LIDEX) 0.05 % Apply topically. 03/05/22   [provider]  Iron-FA-B Cmp-C-Biot-Probiotic (FUSION PLUS) CAPS Take 1 capsule by mouth daily. 05/09/22   [provider]  ketotifen (ZADITOR) 0.025 % ophthalmic solution 1 drop as needed. Patient not taking: Reported on 03/30/2023    [provider]  Multiple Vitamin (MULTIVITAMIN WITH MINERALS) TABS tablet Take 1 tablet by mouth daily. Patient not taking: Reported on 03/30/2023    [provider]  triamcinolone ointment (KENALOG) 0.1 % SMARTSIG:Topical 4 Times a Week Patient not taking: Reported on 03/30/2023 04/11/22   [provider]  Vitamin D, Ergocalciferol, (DRISDOL) 1.25 MG (50000 UNIT) CAPS capsule Take 1 capsule (50,000 Units total) by mouth every 3 (three) days. Patient taking differently: Take 50,000 Units by mouth once a week. 04/26/20   Quillian Quince D, MD      Allergies    Flexeril [cyclobenzaprine hcl], Lamictal [lamotrigine], Morphine and codeine, Percocet [oxycodone-acetaminophen], Doxycycline, Topiramate, and Vimpat [lacosamide]    Review of Systems   Review of Systems  Cardiovascular:  Positive for chest pain and leg swelling.  Musculoskeletal:  Positive for back pain.  All other systems reviewed and are negative.   Physical Exam Updated Vital Signs BP 138/89 (BP Location: Right Arm)   Pulse 64   Temp 98.5 F (36.9 C) (Oral)   Resp 15   Ht 5\' 7"  (1.702 m)   Wt 136.1 kg  LMP 10/13/2019 (Within Days)   SpO2 97%   BMI 46.99 kg/m  Physical Exam Vitals and nursing note reviewed.  Constitutional:      General: Tracy Mcpherson is not in acute distress.    Appearance: Tracy Mcpherson is well-developed. Tracy Mcpherson is obese.  HENT:     Head: Normocephalic and atraumatic.  Eyes:     Conjunctiva/sclera: Conjunctivae normal.  Cardiovascular:     Rate and Rhythm: Normal rate and regular rhythm.     Heart sounds: No murmur heard. Pulmonary:     Effort: Pulmonary effort is normal. No  respiratory distress.     Breath sounds: Normal breath sounds.  Abdominal:     Palpations: Abdomen is soft.     Tenderness: There is no abdominal tenderness.  Musculoskeletal:     Cervical back: Neck supple.     Right lower leg: Edema present.     Left lower leg: Edema present.  Skin:    General: Skin is warm and dry.     Capillary Refill: Capillary refill takes less than 2 seconds.  Neurological:     Mental Status: Tracy Mcpherson is alert.  Psychiatric:        Mood and Affect: Mood normal.     ED Results / Procedures / Treatments   Labs (all labs ordered are listed, but only abnormal results are displayed) Labs Reviewed  BASIC METABOLIC PANEL - Abnormal; Notable for the following components:      Result Value   Glucose, Bld 113 (*)    All other components within normal limits  CBC - Abnormal; Notable for the following components:   WBC 12.2 (*)    Hemoglobin 10.7 (*)    HCT 34.3 (*)    MCV 75.6 (*)    MCH 23.6 (*)    RDW 16.9 (*)    Platelets 414 (*)    All other components within normal limits  D-DIMER, QUANTITATIVE - Abnormal; Notable for the following components:   D-Dimer, Quant 0.56 (*)    All other components within normal limits  TROPONIN I (HIGH SENSITIVITY)    EKG EKG Interpretation Date/Time:  Sunday May 19 2023 10:49:22 EDT Ventricular Rate:  73 PR Interval:  159 QRS Duration:  90 QT Interval:  405 QTC Calculation: 447 R Axis:   8  Text Interpretation: Sinus rhythm Confirmed by Ernie Avena ([id number]) on 05/19/2023 11:41:35 AM  Radiology US Venous Img Lower Bilateral  Result Date: 05/19/2023 CLINICAL DATA:  Leg swelling EXAM: BILATERAL LOWER EXTREMITY VENOUS DOPPLER ULTRASOUND TECHNIQUE: Gray-scale sonography with graded compression, as well as color Doppler and duplex ultrasound were performed to evaluate the lower extremity deep venous systems from the level of the common femoral vein and including the common femoral, femoral, profunda femoral, popliteal and  calf veins including the posterior tibial, peroneal and gastrocnemius veins when visible. The superficial great saphenous vein was also interrogated. Spectral Doppler was utilized to evaluate flow at rest and with distal augmentation maneuvers in the common femoral, femoral and popliteal veins. COMPARISON:  None Available. FINDINGS: RIGHT LOWER EXTREMITY Common Femoral Vein: No evidence of thrombus. Normal compressibility, respiratory phasicity and response to augmentation. Saphenofemoral Junction: No evidence of thrombus. Normal compressibility and flow on color Doppler imaging. Profunda Femoral Vein: No evidence of thrombus. Normal compressibility and flow on color Doppler imaging. Femoral Vein: No evidence of thrombus. Normal compressibility, respiratory phasicity and response to augmentation. Popliteal Vein: No evidence of thrombus. Normal compressibility, respiratory phasicity and response to augmentation. Calf Veins: No evidence of  thrombus. Normal compressibility and flow on color Doppler imaging. Superficial Great Saphenous Vein: No evidence of thrombus. Normal compressibility. Venous Reflux:  None. Other Findings:  None. LEFT LOWER EXTREMITY Common Femoral Vein: No evidence of thrombus. Normal compressibility, respiratory phasicity and response to augmentation. Saphenofemoral Junction: No evidence of thrombus. Normal compressibility and flow on color Doppler imaging. Profunda Femoral Vein: No evidence of thrombus. Normal compressibility and flow on color Doppler imaging. Femoral Vein: No evidence of thrombus. Normal compressibility, respiratory phasicity and response to augmentation. Popliteal Vein: No evidence of thrombus. Normal compressibility, respiratory phasicity and response to augmentation. Calf Veins: No evidence of thrombus. Normal compressibility and flow on color Doppler imaging. Superficial Great Saphenous Vein: No evidence of thrombus. Normal compressibility. Venous Reflux:  None. Other  Findings:  None. IMPRESSION: No evidence of deep venous thrombosis in either lower extremity. Electronically Signed   By: Duanne Guess D.O.   On: 05/19/2023 16:11   CT Angio Chest PE W and/or Wo Contrast  Result Date: 05/19/2023 CLINICAL DATA:  Positive D-dimer, chest pain EXAM: CT ANGIOGRAPHY CHEST WITH CONTRAST TECHNIQUE: Multidetector CT imaging of the chest was performed using the standard protocol during bolus administration of intravenous contrast. Multiplanar CT image reconstructions and MIPs were obtained to evaluate the vascular anatomy. RADIATION DOSE REDUCTION: This exam was performed according to the departmental dose-optimization program which includes automated exposure control, adjustment of the mA and/or kV according to patient size and/or use of iterative reconstruction technique. CONTRAST:  85mL OMNIPAQUE IOHEXOL 350 MG/ML SOLN COMPARISON:  Chest x-ray 05/19/2023, CT chest report 08/21/2010 FINDINGS: Cardiovascular: Satisfactory opacification of the pulmonary arteries to the segmental level. No evidence of pulmonary embolism. Normal heart size. No pericardial effusion. Nonaneurysmal aorta. Mediastinum/Nodes: No enlarged mediastinal, hilar, or axillary lymph nodes. Thyroid gland, trachea, and esophagus demonstrate no significant findings. Lungs/Pleura: Lungs are clear. No pleural effusion or pneumothorax. Upper Abdomen: No acute abnormality. Musculoskeletal: No chest wall abnormality. No acute or significant osseous findings. Review of the MIP images confirms the above findings. IMPRESSION: 1. Negative for acute pulmonary embolus. 2. Clear lung fields. Electronically Signed   By: Jasmine Pang M.D.   On: 05/19/2023 15:19   DG Chest 2 View  Result Date: 05/19/2023 CLINICAL DATA:  Chest pain EXAM: CHEST - 2 VIEW COMPARISON:  03/29/2023 FINDINGS: The heart size and mediastinal contours are within normal limits. No focal airspace consolidation, pleural effusion, or pneumothorax. The  visualized skeletal structures are unremarkable. IMPRESSION: No active cardiopulmonary disease. Electronically Signed   By: Duanne Guess D.O.   On: 05/19/2023 11:27    Procedures Procedures    Medications Ordered in ED Medications  alum & mag hydroxide-simeth (MAALOX/MYLANTA) 200-200-20 MG/5ML suspension 30 mL (30 mLs Oral Given 05/19/23 1213)    And  lidocaine (XYLOCAINE) 2 % viscous mouth solution 15 mL (15 mLs Oral Given 05/19/23 1212)  famotidine (PEPCID) tablet 20 mg (20 mg Oral Given 05/19/23 1213)  iohexol (OMNIPAQUE) 350 MG/ML injection 100 mL (85 mLs Intravenous Contrast Given 05/19/23 1411)  ondansetron (ZOFRAN) injection 4 mg (4 mg Intravenous Given 05/19/23 1426)    ED Course/ Medical Decision Making/ A&P                             Medical Decision Making Amount and/or Complexity of Data Reviewed Labs: ordered. Radiology: ordered.  Risk OTC drugs. Prescription drug management.    52 year old female presenting to the emergency department with a chief complaint  of chest pain for the past week.  The pain has been sharp, substernal, radiating to Tracy Mcpherson back and left arm.  The pain has been intermittent, burning in component and intermittently sharp.  Feels different from previous episodes of reflux.  Tracy Mcpherson endorses mild shortness of breath, denies any fever, chills.  No cough.  Tracy Mcpherson states that Tracy Mcpherson has had some asymmetric leg swelling, has a history of lymphedema.    On arrival, the patient was afebrile, not tachycardic or tachypneic, hypertensive BP 157/96, saturating 98% on room air.  Physical exam significant for bilateral lower extremity edema, otherwise generally unremarkable.  Workup initiated to evaluate the patient's chest discomfort.  Considered GERD, ACS, PE, viral infection, pneumothorax, pneumonia.  Laboratory evaluation significant for CBC with a nonspecific leukocytosis to 12.2, anemia to 10.7, troponin negative, BMP unremarkable, D-dimer elevated to 0.56.  Do  not think a repeat troponin is indicated given the duration of the patient's symptoms for the past week.  Chest x-ray was unremarkable and EKG revealed no acute ischemic changes, sinus rhythm, ventricular rate 73.  CTA PE imaging and ultrasound of the lower extremities was ordered and pending at time of signout to further evaluate for possible DVT and/for PE, if negative, strongly suspect GERD as the etiology of the patient's symptoms.  The patient was administered Zofran, Maalox and lidocaine and Pepcid orally.  On repeat assessment, Tracy Mcpherson pain was well-controlled.  Tracy Mcpherson was pending imaging at time of signout, signout given to Dr. Rush Landmark at [phone].   Final Clinical Impression(s) / ED Diagnoses Final diagnoses:  Gastroesophageal reflux disease, unspecified whether esophagitis present    Rx / DC Orders ED Discharge Orders          Ordered    famotidine (PEPCID) 20 MG tablet  2 times daily        05/19/23 1511    pantoprazole (PROTONIX) 40 MG tablet  Daily        05/19/23 1511              Ernie Avena, MD 05/21/23 1010

## 2023-05-19 NOTE — ED Provider Notes (Signed)
3:16 PM Care assumed from Dr. Florentina Addison.  At time of transfer of care, patient is awaiting for results of ultrasound and CT scan to rule out DVT or PE contrary to the patient's leg symptoms and chest symptoms.  If workup reassuring, plan of care is to discharge home for outpatient follow-up with new medications for reflux.  5:02 PM PE study was negative for abnormality and ultrasound negative for clots in the legs.  We went through these findings with the patient and patient we discharged home per previous plan.  Patient agrees and was discharged in good condition for outpatient follow-up.   Clinical Impression: 1. Gastroesophageal reflux disease, unspecified whether esophagitis present     Disposition: Discharge  Condition: Good  I have discussed the results, Dx and Tx plan with the pt(& family if present). He/she/they expressed understanding and agree(s) with the plan. Discharge instructions discussed at great length. Strict return precautions discussed and pt &/or family have verbalized understanding of the instructions. No further questions at time of discharge.    New Prescriptions   FAMOTIDINE (PEPCID) 20 MG TABLET    Take 1 tablet (20 mg total) by mouth 2 (two) times daily.   PANTOPRAZOLE (PROTONIX) 40 MG TABLET    Take 1 tablet (40 mg total) by mouth daily.    Follow Up: Mila Palmer, MD [address] [place] [address] [place] [address] [phone]  Schedule an appointment as soon as possible for a visit       Chesney Suares, Canary Brim, MD 05/19/23 [phone]

## 2023-05-20 DIAGNOSIS — N930 Postcoital and contact bleeding: Secondary | ICD-10-CM | POA: Diagnosis not present

## 2023-05-20 DIAGNOSIS — Z1231 Encounter for screening mammogram for malignant neoplasm of breast: Secondary | ICD-10-CM | POA: Diagnosis not present

## 2023-05-21 LAB — COLOGUARD: COLOGUARD: NEGATIVE

## 2023-05-21 LAB — EXTERNAL GENERIC LAB PROCEDURE: COLOGUARD: NEGATIVE

## 2023-05-22 DIAGNOSIS — R21 Rash and other nonspecific skin eruption: Secondary | ICD-10-CM | POA: Diagnosis not present

## 2023-05-22 DIAGNOSIS — K219 Gastro-esophageal reflux disease without esophagitis: Secondary | ICD-10-CM | POA: Diagnosis not present

## 2023-05-24 ENCOUNTER — Encounter: Payer: Self-pay | Admitting: Hematology and Oncology

## 2023-05-30 DIAGNOSIS — D250 Submucous leiomyoma of uterus: Secondary | ICD-10-CM | POA: Diagnosis not present

## 2023-05-30 DIAGNOSIS — D251 Intramural leiomyoma of uterus: Secondary | ICD-10-CM | POA: Diagnosis not present

## 2023-05-30 DIAGNOSIS — N926 Irregular menstruation, unspecified: Secondary | ICD-10-CM | POA: Diagnosis not present

## 2023-05-30 DIAGNOSIS — Z780 Asymptomatic menopausal state: Secondary | ICD-10-CM | POA: Diagnosis not present

## 2023-06-03 DIAGNOSIS — K2950 Unspecified chronic gastritis without bleeding: Secondary | ICD-10-CM | POA: Diagnosis not present

## 2023-06-03 DIAGNOSIS — Z6841 Body Mass Index (BMI) 40.0 and over, adult: Secondary | ICD-10-CM | POA: Diagnosis not present

## 2023-06-08 ENCOUNTER — Ambulatory Visit (HOSPITAL_BASED_OUTPATIENT_CLINIC_OR_DEPARTMENT_OTHER): Payer: BC Managed Care – PPO | Attending: Student | Admitting: Rehabilitative and Restorative Service Providers"

## 2023-06-08 ENCOUNTER — Encounter (HOSPITAL_BASED_OUTPATIENT_CLINIC_OR_DEPARTMENT_OTHER): Payer: Self-pay | Admitting: Rehabilitative and Restorative Service Providers"

## 2023-06-08 DIAGNOSIS — M25561 Pain in right knee: Secondary | ICD-10-CM | POA: Diagnosis not present

## 2023-06-08 DIAGNOSIS — M6281 Muscle weakness (generalized): Secondary | ICD-10-CM | POA: Insufficient documentation

## 2023-06-08 DIAGNOSIS — G8929 Other chronic pain: Secondary | ICD-10-CM | POA: Diagnosis not present

## 2023-06-08 DIAGNOSIS — M25562 Pain in left knee: Secondary | ICD-10-CM | POA: Insufficient documentation

## 2023-06-08 DIAGNOSIS — R262 Difficulty in walking, not elsewhere classified: Secondary | ICD-10-CM | POA: Diagnosis not present

## 2023-06-08 NOTE — Progress Notes (Deleted)
Called and left message 2nd to no-show. Patient advised of her next appointment. She was advised of our attendance policy.

## 2023-06-08 NOTE — Therapy (Signed)
OUTPATIENT PHYSICAL THERAPY LOWER EXTREMITY TREATMENT   Patient Name: Tracy Mcpherson MRN: [id number] DOB:02-21-1971, 52 y.o., female Today's Date: 06/08/2023  END OF SESSION:  PT End of Session - 06/08/23 1229     Visit Number 6    Date for PT Re-Evaluation 06/29/23    Authorization Type [employer]    PT Start Time 1142    PT Stop Time 1223    PT Time Calculation (min) 41 min    Activity Tolerance Patient tolerated treatment well;Patient limited by pain    Behavior During Therapy WFL for tasks assessed/performed               Past Medical History:  Diagnosis Date   Anemia    Anxiety    Asthma    Constipation    Epilepsy (HCC)    Fibroids    History of gestational diabetes    History of herpes simplex type 2 infection    History of pregnancy induced hypertension    Hx of leukocytosis    mild   Iron deficiency anemia    Joint pain    Lower extremity edema    Migraines    Morbid obesity (HCC)    Pain in both lower legs    Plantar fasciitis    Prediabetes    Right knee pain    Seasonal allergies    Seizures (HCC)    Sleep apnea    Vitamin B 12 deficiency    Vitamin D deficiency    Past Surgical History:  Procedure Laterality Date   CESAREAN SECTION  11-02-2007   CRANIOTOMY FOR TEMPORAL LOBECTOMY     GYNECOLOGIC CRYOSURGERY     IUD REMOVAL  12-16-2009   REMOVAL THYROID CYST  2000   Patient Active Problem List   Diagnosis Date Noted   Alopecia 05/31/2022   Postmenopausal bleeding 05/31/2022   Asthma without status asthmaticus 05/31/2022   Gastroesophageal reflux disease 05/31/2022   Common migraine 05/31/2022   Sacroiliac joint dysfunction of right side 11/10/2021   Plantar fasciitis, bilateral 11/10/2021   Patellofemoral pain syndrome of both knees 08/21/2021   Vitamin D deficiency 05/12/2020   Prediabetes 05/12/2020   Class 3 severe obesity with serious comorbidity and body mass index (BMI) of 50.0 to 59.9 in adult (HCC) 05/12/2020   Unspecified  deficiency anemia 02/20/2012   Seizure disorder (HCC) 02/20/2012   Leucocytosis 02/20/2012    PCP: Mila Palmer, MD   REFERRING PROVIDER:   Huel Cote, MD    REFERRING DIAG:  Diagnosis  M25.561,G89.29 (ICD-10-CM) - Chronic pain of right knee  M25.562,G89.29 (ICD-10-CM) - Chronic pain of left knee    THERAPY DIAG:  Chronic pain of left knee  Chronic pain of right knee  Muscle weakness (generalized)  Difficulty in walking, not elsewhere classified  Rationale for Evaluation and Treatment: Rehabilitation  ONSET DATE: 10/23  SUBJECTIVE:   SUBJECTIVE STATEMENT: I went to the ER last time after my last tx b/c to be honest, my chest had been hurting. They did testing and gave me heart burn meds. R knee was red and burning and it popped out of place and then I popped it back in yesterday. Pt late for appt today     PERTINENT HISTORY: Asthma, hx of seizures (none since 2013), sleep apnea, anemia, anxiety, prediabetes, plantar fascitis.   PAIN:  Are you having pain? Yes: NPRS scale: 7/10 Pain location: R knee medially; L knee lateral and posteriorly Pain description: Burning, sharp, tight Aggravating factors: sitting>  30 minutes, ascending/descending stairs, getting out of a chair, standing > 15 minutes Relieving factors:  pain medication, heating pad, movement/exercise    PRECAUTIONS: None   WEIGHT BEARING RESTRICTIONS No   FALLS:  Has patient fallen in last 6 months? No   LIVING ENVIRONMENT: Lives with: lives with their family Lives in: House/apartment Stairs: No Has following equipment at home: None   OCCUPATION: Hotel manager; does travel for work    Hobbies:  Wellsite geologist to walk  [employer] to [employer]    PLOF: Independent   PATIENT GOALS: able to do things with her son; be able to go on vacation     OBJECTIVE:   DIAGNOSTIC FINDINGS:  R knee x ray  IMPRESSION: Moderate tricompartmental osteoarthritis, most prominent in the medial  tibiofemoral compartment.  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 Small joint effusion.    PATIENT SURVEYS:  FOTO 31  55 @ DC  19 pts MCII   COGNITION: Overall cognitive status: Within functional limits for tasks assessed     SENSATION: WFL   MUSCLE LENGTH: Positive Ely's Positive HS 90/90  POSTURE: No Significant postural limitations  PALPATION: TTP bilat knee joint lines; medial on R; more lateral pain on L; patellar tendon sensitivity ( potentially more sensitive on medial right knee on re-assessment)   LOWER EXTREMITY ROM: moderately limited at bilateral knees due to joint stiffness, pain, and body habitus; hip ROM deferred today   LOWER EXTREMITY MMT: SECOND MEASUREMENT RECERT MEASUREMENT 05/18/23  MMT Right eval Left eval  Hip flexion 27.9/20.3 27.8/22.9  Hip extension    Hip abduction 15.9/16.4 20.1/16.0  Hip adduction    Hip internal rotation    Hip external rotation    Knee flexion    Knee extension 23.5/ 24.2 16.0/20.7   (Blank rows = not tested)  Pain with end range active and passive flexion   FUNCTIONAL TESTS:    GAIT: Distance walked: 274ft Assistive device utilized: None Level of assistance: Complete Independence Comments: bilat Trendelenberg; progressively worsens with duration of walking ( same on 2nd eval 5/25)    TODAY'S TREATMENT:                                                                                                                              DATE:   06/08/23:  R hamstring dig x 20 with 3 sec hold R bridge x 20  L hamstring stretch/Piriformis stretch x 30 sec each Prone hip ext with hip abdct/addct combo x 20 unilaterally and then bilaterally Alternating donkey kick x 20 prone Prone bil hip ext with hip abduct/addct combo x 20  Unilateral donkey kick x 20 performed bil 6" lateral step ups x 20 Seated 5 lb unilateral LAQ x 20 Ball squeeze with bil LAQ  x 20 Ball squeeze with STS x 15 No complaints of pain after tx.   [facility] Adult PT Treatment:  DATE: 05/18/23 Therapeutic Exercise: Quad sets x 15 R SLR x 15 R Sidelying R clam shell x 20 with 2-3 sec hold Sidelying R hip abduction x 20 with 2-3 sec hold Sidelying combo: hip flexion/hip abduction/hip ext x 20 in ER R Prone 5 lb hamstring curl unilaterally bil x 20 each Prone hip ext without weight with glute set x 20 each LE Donkey kick x 20 each LE All with PT palpation for proper muscle contraction and modifications as needed for discomfort or technique  Therapeutic Activity: Discussed concerns over swelling; discussed benefits of thigh TED hose for swelling bil; recommended pt return to MD; issued directions for heating/icing as pt's bil lower extremities are swollen, especially superior to patella and around sock ribbing. Advised pt to go without socks if she doesn't have socks that more comfortably fit vs socks that increase swelling.Discussed water exercises beginning as pt has not performed water therex yet.   [facility] Adult PT Treatment:                                                DATE: 05/11/23 Therapeutic Exercise: Sidelying clam shell x 20 Sidelying combo: hip flexion/hip abduction/hip ext x 20 in ER Ball squeeze with bridge x 20 Prone 5 lb hamstring curl unilaterally bil x 20 each Prone hip ext without weight with glute set x 20 each but with increased difficulty on L LE due to weakness Prone donkey kick x 20 with glute set Prone heel squeeze x 20 with 2-3 sec hold Standing lunge limited AROM at sink countertop x 10 each side Standing calf raise at countertop x 20 Standing diagonal hip abdct/ext combo x 20 at countertop New HEP issued   [facility] Adult PT Treatment:                                                DATE: 04/20/23 Therapeutic Exercise: Gait in the hallway as warmup x 2 min with PT looking for gait abnormalities Quad sets  x 20 SLR x 20 VMO SLR x 20 Ball squeeze x 20 Ball squeeze with bridge x 20 Iso ball squeeze with bridge with bil shoulder flex/ext x 20 SAQ 2.5lbs x 20 SAQ 2.5lbs bil with ball squeeze x 20 HS digs x 20 with 2-3 sec hold Sidelying hip abdct x 20 2 lbs Hamstring stretch seated x 30 sec Butterfly stretch 2x30 sec All therex performed bil   [facility] Adult PT Treatment:                                                DATE: 04/06/23 Therapeutic Exercise: Quad set x 15 SLR x 15 VMO SLR x 15 S/L hip abduction x 15 Ball squeeze x 15 Ball squeeze with bridge x 15 SAQ 2 lbs unilat/bil x 15 each Seated Hamstring stretch 1 x 30 sec  Seated hip abduction with GTB x 10 Answered questions regarding [employer] gym equipment All therex performed bil   1/19  Access Code: [email or web address] URL: [email or web address] Date: 03/30/2023 Prepared by: Lorayne Bender  Exercises - Seated Hamstring Stretch  -  1 x daily - 7 x weekly - 3 sets - 3 reps - 20sec  hold - Supine Quad Set  - 3-4 x daily - 7 x weekly - 3 sets - 10 reps - 5 sec  hold - Active Straight Leg Raise with Quad Set  - 1 x daily - 7 x weekly - 3 sets - 10 reps - Seated Hip Abduction with Resistance  - 1 x daily - 7 x weekly - 3 sets - 10 reps  05/11/23 Access Code: [email or web address] URL: [email or web address] Date: 05/11/2023 Prepared by: Luna Fuse  Exercises - Sitting Knee Extension with Resistance  - 1 x daily - 7 x weekly - 3 sets - 10 reps - Seated Hamstring Curl with Anchored Resistance  - 1 x daily - 7 x weekly - 3 sets - 10 reps - Sit to Stand with Arms Crossed  - 1 x daily - 7 x weekly - 4 sets - 5 reps - Standing March with Counter Support  - 1 x daily - 3 x weekly - 1 sets - 10 reps - Standing Knee Flexion AROM with Chair Support  - 1 x daily - 3 x weekly - 1 sets - 10 reps - Standing Hip Extension with Counter Support  - 1 x daily - 3 x weekly - 1 sets - 10 reps - Sidestepping  - 1 x daily - 3 x weekly - 3  sets - 10 reps   PATIENT EDUCATION:  Education details: MOI, diagnosis, prognosis, anatomy, exercise progression, DOMS expectations, muscle firing,  envelope of function, HEP, POC  Person educated: Patient Education method: Explanation, Demonstration, Tactile cues, Verbal cues, and Handouts Education comprehension: verbalized understanding, returned demonstration, verbal cues required, and tactile cues required  HOME EXERCISE PROGRAM:  Access Code: [email or web address] URL: [email or web address] Date: 05/11/23 Prepared NF:AOZHY Christell Constant  ASSESSMENT:  CLINICAL IMPRESSION: Pt has missed last 2 PT txs due to medical complexities. She presents to PT today with continued c/o bil knee pain and R knee popping out yesterday. Due to scheduling and medical complexities, she has not contacted Dr. Steward Drone but PT continues to advise followup with MD. PT also recommends pool exercise which pt has not performed yet due to the above.    OBJECTIVE IMPAIRMENTS Abnormal gait, decreased activity tolerance, decreased balance, decreased endurance, decreased mobility, difficulty walking, decreased ROM, decreased strength, hypomobility, increased edema, impaired flexibility, improper body mechanics, postural dysfunction, obesity, and pain.    ACTIVITY LIMITATIONS carrying, lifting, bending, sitting, standing, squatting, and stairs   PARTICIPATION LIMITATIONS: cleaning, laundry, driving, shopping, community activity, occupation, and yard work   PERSONAL FACTORS Time since onset of injury/illness/exacerbation, 3+ comorbidities: obesity, OA, anxiety and plantar fascitis  are also affecting patient's functional outcome.    REHAB POTENTIAL: Good   CLINICAL DECISION MAKING: Low/stable   EVALUATION COMPLEXITY: low     GOALS: SHORT TERM GOALS: Target date: 06/15/23   Pt will become independent with HEP in order to demonstrate synthesis of PT education.   Goal status: INITIAL goal re-established on 4/25 Goal  05/18/23: MET   2.  Pt will score >/= 19 on FOTO to demonstrate improvement in perceived L LE function.  goal re-established on 4/25   Goal status: INITIAL Goal 05/18/23: Ongoing   3.  Pt will report at least 2 pt reduction on NPRS scale for pain during ADL in order to demonstrate functional improvement with household activity, self care, and ADL.  goal re-established on 4/25   Goal status: INITIAL Goal  05/18/23: 7/10    LONG TERM GOALS: Target date: 06/29/23     Pt  will become independent with final HEP in order to demonstrate synthesis of PT education.   goal re-established on 4/25 Goal status 05/18/23: MET   2.  Pt will be able to demonstrate ascending and descending stairs with reciprocal pattern in order to demonstrate functional improvement in LE function for self-care and house hold duties.   goal re-established on 4/25     Goal status: INITIAL Goal 05/18/23: Unmet; Continue   3.  Pt will score >/= 47 on FOTO to demonstrate improvement in perceived bilat Knee function.    Goal status: INITIAL Goal 05/18/23: unmet; continue   4.  Pt will be able to demonstrate/report ability to walk > 50 mins  in order to demonstrate functional improvement and tolerance to exercise and community mobility.   Goal status: INITIAL goal re-established on 4/25 Goal 05/18/23: MET  5. Pt will be able to transfer sit to stand at work after sitting x 1 hour with pain </= 5/10  Initial 05/18/23: 9/10         PLAN: PT FREQUENCY: 1-2x/week   PT DURATION: 6 weeks   PLANNED INTERVENTIONS: Therapeutic exercises, Therapeutic activity, Neuromuscular re-education, Balance training, Gait training, Patient/Family education, Self Care, Joint mobilization, Joint manipulation, Stair training, Orthotic/Fit training, Aquatic Therapy, Dry Needling, Electrical stimulation, Spinal manipulation, Spinal mobilization, Cryotherapy, Moist heat, Taping, Traction, Biofeedback, Ionotophoresis 4mg /ml Dexamethasone, Manual  therapy, and Re-evaluation   PLAN FOR NEXT SESSION: Did pt began water therapy at her pool; assess knee popping and R medial knee burning; continue posterior chain strengthening? Did she get appt with Dr. Vincent [facility] at [facility] [address] [place], [place], [address] Phone: [phone]   Fax:  [phone]  Patient Details  Name: Tracy Mcpherson MRN: [id number] Date of Birth: [birth year] Referring Provider:  Amador Cunas, [place]*  Encounter Date: 06/08/2023  Luna Fuse, PT, DTaP 06/08/23 1234PM    [facility] at [facility] [address] [place], [place], [address] Phone: [phone]   Fax:  [phone]

## 2023-06-14 ENCOUNTER — Ambulatory Visit (HOSPITAL_BASED_OUTPATIENT_CLINIC_OR_DEPARTMENT_OTHER): Payer: BC Managed Care – PPO | Admitting: Orthopaedic Surgery

## 2023-06-15 ENCOUNTER — Encounter (HOSPITAL_BASED_OUTPATIENT_CLINIC_OR_DEPARTMENT_OTHER): Payer: Self-pay | Admitting: Rehabilitative and Restorative Service Providers"

## 2023-06-15 ENCOUNTER — Ambulatory Visit (HOSPITAL_BASED_OUTPATIENT_CLINIC_OR_DEPARTMENT_OTHER): Payer: BC Managed Care – PPO | Admitting: Rehabilitative and Restorative Service Providers"

## 2023-06-15 DIAGNOSIS — M6281 Muscle weakness (generalized): Secondary | ICD-10-CM

## 2023-06-15 DIAGNOSIS — M25561 Pain in right knee: Secondary | ICD-10-CM | POA: Diagnosis not present

## 2023-06-15 DIAGNOSIS — G8929 Other chronic pain: Secondary | ICD-10-CM

## 2023-06-15 DIAGNOSIS — M25562 Pain in left knee: Secondary | ICD-10-CM | POA: Diagnosis not present

## 2023-06-15 DIAGNOSIS — R262 Difficulty in walking, not elsewhere classified: Secondary | ICD-10-CM | POA: Diagnosis not present

## 2023-06-15 NOTE — Therapy (Signed)
OUTPATIENT PHYSICAL THERAPY LOWER EXTREMITY TREATMENT   Patient Name: Tracy Mcpherson MRN: [id number] DOB:1971/03/17, 52 y.o., female Today's Date: 06/15/2023  END OF SESSION:  PT End of Session - 06/15/23 1141     Visit Number 7    Number of Visits 14    Date for PT Re-Evaluation 06/29/23    Authorization Type BCBS    PT Start Time 1138    PT Stop Time 1221    PT Time Calculation (min) 43 min    Activity Tolerance Patient tolerated treatment well;Patient limited by pain    Behavior During Therapy WFL for tasks assessed/performed                Past Medical History:  Diagnosis Date   Anemia    Anxiety    Asthma    Constipation    Epilepsy (HCC)    Fibroids    History of gestational diabetes    History of herpes simplex type 2 infection    History of pregnancy induced hypertension    Hx of leukocytosis    mild   Iron deficiency anemia    Joint pain    Lower extremity edema    Migraines    Morbid obesity (HCC)    Pain in both lower legs    Plantar fasciitis    Prediabetes    Right knee pain    Seasonal allergies    Seizures (HCC)    Sleep apnea    Vitamin B 12 deficiency    Vitamin D deficiency    Past Surgical History:  Procedure Laterality Date   CESAREAN SECTION  11-02-2007   CRANIOTOMY FOR TEMPORAL LOBECTOMY     GYNECOLOGIC CRYOSURGERY     IUD REMOVAL  12-16-2009   REMOVAL THYROID CYST  2000   Patient Active Problem List   Diagnosis Date Noted   Alopecia 05/31/2022   Postmenopausal bleeding 05/31/2022   Asthma without status asthmaticus 05/31/2022   Gastroesophageal reflux disease 05/31/2022   Common migraine 05/31/2022   Sacroiliac joint dysfunction of right side 11/10/2021   Plantar fasciitis, bilateral 11/10/2021   Patellofemoral pain syndrome of both knees 08/21/2021   Vitamin D deficiency 05/12/2020   Prediabetes 05/12/2020   Class 3 severe obesity with serious comorbidity and body mass index (BMI) of 50.0 to 59.9 in adult (HCC)  05/12/2020   Unspecified deficiency anemia 02/20/2012   Seizure disorder (HCC) 02/20/2012   Leucocytosis 02/20/2012    PCP: Mila Palmer, MD   REFERRING PROVIDER:   Huel Cote, MD    REFERRING DIAG:  Diagnosis  M25.561,G89.29 (ICD-10-CM) - Chronic pain of right knee  M25.562,G89.29 (ICD-10-CM) - Chronic pain of left knee    THERAPY DIAG:  Chronic pain of left knee  Chronic pain of right knee  Muscle weakness (generalized)  Difficulty in walking, not elsewhere classified  Rationale for Evaluation and Treatment: Rehabilitation  ONSET DATE: 10/23  SUBJECTIVE:   SUBJECTIVE STATEMENT: When I stand up at my desk at work, I have a lot of pain.      PERTINENT HISTORY: Asthma, hx of seizures (none since 2013), sleep apnea, anemia, anxiety, prediabetes, plantar fascitis.   PAIN:  Are you having pain? Yes: NPRS scale: 1/10 Pain location: R knee medially; L knee lateral and posteriorly Pain description: Burning, sharp, tight Aggravating factors: sitting> 30 minutes, ascending/descending stairs, getting out of a chair, standing > 15 minutes Relieving factors:  pain medication, heating pad, movement/exercise    PRECAUTIONS: None   WEIGHT BEARING  RESTRICTIONS No   FALLS:  Has patient fallen in last 6 months? No   LIVING ENVIRONMENT: Lives with: lives with their family Lives in: House/apartment Stairs: No Has following equipment at home: None   OCCUPATION: Hotel manager; does travel for work    Hobbies:  Wellsite geologist to walk  [employer] to [employer]    PLOF: Independent   PATIENT GOALS: able to do things with her son; be able to go on vacation     OBJECTIVE:   DIAGNOSTIC FINDINGS:  R knee x ray  IMPRESSION: Moderate tricompartmental osteoarthritis, most prominent in the medial tibiofemoral compartment.  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 Small joint effusion.     PATIENT SURVEYS:  FOTO 31  55 @ DC  19 pts MCII   COGNITION: Overall cognitive status: Within functional limits for tasks assessed     SENSATION: WFL   MUSCLE LENGTH: Positive Ely's Positive HS 90/90  POSTURE: No Significant postural limitations  PALPATION: TTP bilat knee joint lines; medial on R; more lateral pain on L; patellar tendon sensitivity ( potentially more sensitive on medial right knee on re-assessment)   LOWER EXTREMITY ROM: moderately limited at bilateral knees due to joint stiffness, pain, and body habitus; hip ROM deferred today   LOWER EXTREMITY MMT: SECOND MEASUREMENT RECERT MEASUREMENT 05/18/23  MMT Right eval Left eval  Hip flexion 27.9/20.3 27.8/22.9  Hip extension    Hip abduction 15.9/16.4 20.1/16.0  Hip adduction    Hip internal rotation    Hip external rotation    Knee flexion    Knee extension 23.5/ 24.2 16.0/20.7   (Blank rows = not tested)  Pain with end range active and passive flexion   FUNCTIONAL TESTS:    GAIT: Distance walked: 285ft Assistive device utilized: None Level of assistance: Complete Independence Comments: bilat Trendelenberg; progressively worsens with duration of walking ( same on 2nd eval 5/25)    TODAY'S TREATMENT:                                                                                                                              DATE:   06/15/23: Ball squeeze with bridge x 20 while discussing 90-90-90 positioning at desk at work and getting a mat for underneath chair with wheels so it doesn't roll Ball squeeze with iso bridge and cane shoulder press x 20 Ball squeeze with alternating LAQ x 20 SLR 4 lbs x 20 SLR/hip abduction combo lbs x 20 Standing 4 lb hamstring curls x 20 Standing 4 lb hip abduction x 20 Standing 4 lb hip abduction/extension combo x 20 Standing hip flexor/quad stretch x 30 sec No complaints of pain after tx. All performed bil  06/08/23:  R hamstring dig x 20 with 3 sec  hold R bridge x 20  L hamstring stretch/Piriformis stretch x 30 sec each Prone hip ext with hip abdct/addct combo x 20 unilaterally and then bilaterally  Alternating donkey kick x 20 prone Prone bil hip ext with hip abduct/addct combo x 20  Unilateral donkey kick x 20 performed bil 6" lateral step ups x 20 Seated 5 lb unilateral LAQ x 20 Ball squeeze with bil LAQ x 20 Ball squeeze with STS x 15 No complaints of pain after tx.   [facility] Adult PT Treatment:                                                DATE: 05/18/23 Therapeutic Exercise: Quad sets x 15 R SLR x 15 R Sidelying R clam shell x 20 with 2-3 sec hold Sidelying R hip abduction x 20 with 2-3 sec hold Sidelying combo: hip flexion/hip abduction/hip ext x 20 in ER R Prone 5 lb hamstring curl unilaterally bil x 20 each Prone hip ext without weight with glute set x 20 each LE Donkey kick x 20 each LE All with PT palpation for proper muscle contraction and modifications as needed for discomfort or technique  Therapeutic Activity: Discussed concerns over swelling; discussed benefits of thigh TED hose for swelling bil; recommended pt return to MD; issued directions for heating/icing as pt's bil lower extremities are swollen, especially superior to patella and around sock ribbing. Advised pt to go without socks if she doesn't have socks that more comfortably fit vs socks that increase swelling.Discussed water exercises beginning as pt has not performed water therex yet.   [facility] Adult PT Treatment:                                                DATE: 05/11/23 Therapeutic Exercise: Sidelying clam shell x 20 Sidelying combo: hip flexion/hip abduction/hip ext x 20 in ER Ball squeeze with bridge x 20 Prone 5 lb hamstring curl unilaterally bil x 20 each Prone hip ext without weight with glute set x 20 each but with increased difficulty on L LE due to weakness Prone donkey kick x 20 with glute set Prone heel squeeze x 20 with 2-3 sec  hold Standing lunge limited AROM at sink countertop x 10 each side Standing calf raise at countertop x 20 Standing diagonal hip abdct/ext combo x 20 at countertop New HEP issued   [facility] Adult PT Treatment:                                                DATE: 04/20/23 Therapeutic Exercise: Gait in the hallway as warmup x 2 min with PT looking for gait abnormalities Quad sets x 20 SLR x 20 VMO SLR x 20 Ball squeeze x 20 Ball squeeze with bridge x 20 Iso ball squeeze with bridge with bil shoulder flex/ext x 20 SAQ 2.5lbs x 20 SAQ 2.5lbs bil with ball squeeze x 20 HS digs x 20 with 2-3 sec hold Sidelying hip abdct x 20 2 lbs Hamstring stretch seated x 30 sec Butterfly stretch 2x30 sec All therex performed bil   [facility] Adult PT Treatment:  DATE: 04/06/23 Therapeutic Exercise: Quad set x 15 SLR x 15 VMO SLR x 15 S/L hip abduction x 15 Ball squeeze x 15 Ball squeeze with bridge x 15 SAQ 2 lbs unilat/bil x 15 each Seated Hamstring stretch 1 x 30 sec  Seated hip abduction with GTB x 10 Answered questions regarding [employer] gym equipment All therex performed bil   1/19  Access Code: [email or web address] URL: [email or web address] Date: 03/30/2023 Prepared by: Lorayne Bender  Exercises - Seated Hamstring Stretch  - 1 x daily - 7 x weekly - 3 sets - 3 reps - 20sec  hold - Supine Quad Set  - 3-4 x daily - 7 x weekly - 3 sets - 10 reps - 5 sec  hold - Active Straight Leg Raise with Quad Set  - 1 x daily - 7 x weekly - 3 sets - 10 reps - Seated Hip Abduction with Resistance  - 1 x daily - 7 x weekly - 3 sets - 10 reps  05/11/23 Access Code: [email or web address] URL: [email or web address] Date: 05/11/2023 Prepared by: Luna Fuse  Exercises - Sitting Knee Extension with Resistance  - 1 x daily - 7 x weekly - 3 sets - 10 reps - Seated Hamstring Curl with Anchored Resistance  - 1 x daily - 7 x weekly - 3 sets - 10 reps - Sit  to Stand with Arms Crossed  - 1 x daily - 7 x weekly - 4 sets - 5 reps - Standing March with Counter Support  - 1 x daily - 3 x weekly - 1 sets - 10 reps - Standing Knee Flexion AROM with Chair Support  - 1 x daily - 3 x weekly - 1 sets - 10 reps - Standing Hip Extension with Counter Support  - 1 x daily - 3 x weekly - 1 sets - 10 reps - Sidestepping  - 1 x daily - 3 x weekly - 3 sets - 10 reps   PATIENT EDUCATION:  Education details: MOI, diagnosis, prognosis, anatomy, exercise progression, DOMS expectations, muscle firing,  envelope of function, HEP, POC  Person educated: Patient Education method: Explanation, Demonstration, Tactile cues, Verbal cues, and Handouts Education comprehension: verbalized understanding, returned demonstration, verbal cues required, and tactile cues required  HOME EXERCISE PROGRAM:  Access Code: [email or web address] URL: [email or web address] Date: 05/11/23 Prepared VO:ZDGUY Christell Constant  ASSESSMENT:  CLINICAL IMPRESSION: Pt reports continued knee pain in AM and upon standing from work chair. Discussed work station setup and needing a mat under wheeled chair. Pt states no other change in knee pain. Set to see MD on  Monday.   OBJECTIVE IMPAIRMENTS Abnormal gait, decreased activity tolerance, decreased balance, decreased endurance, decreased mobility, difficulty walking, decreased ROM, decreased strength, hypomobility, increased edema, impaired flexibility, improper body mechanics, postural dysfunction, obesity, and pain.    ACTIVITY LIMITATIONS carrying, lifting, bending, sitting, standing, squatting, and stairs   PARTICIPATION LIMITATIONS: cleaning, laundry, driving, shopping, community activity, occupation, and yard work   PERSONAL FACTORS Time since onset of injury/illness/exacerbation, 3+ comorbidities: obesity, OA, anxiety and plantar fascitis  are also affecting patient's functional outcome.    REHAB POTENTIAL: Good   CLINICAL DECISION MAKING:  Low/stable   EVALUATION COMPLEXITY: low     GOALS: SHORT TERM GOALS: Target date: 06/15/23   Pt will become independent with HEP in order to demonstrate synthesis of PT education.   Goal status: INITIAL goal re-established on 4/25 Goal 05/18/23: MET   2.  Pt will score >/= 19 on FOTO to demonstrate improvement  in perceived L LE function.  goal re-established on 4/25   Goal status: INITIAL Goal 05/18/23: Ongoing   3.  Pt will report at least 2 pt reduction on NPRS scale for pain during ADL in order to demonstrate functional improvement with household activity, self care, and ADL.  goal re-established on 4/25   Goal status: INITIAL Goal 05/18/23: 7/10    LONG TERM GOALS: Target date: 06/29/23     Pt  will become independent with final HEP in order to demonstrate synthesis of PT education.   goal re-established on 4/25 Goal status 05/18/23: MET   2.  Pt will be able to demonstrate ascending and descending stairs with reciprocal pattern in order to demonstrate functional improvement in LE function for self-care and house hold duties.   goal re-established on 4/25     Goal status: INITIAL Goal 05/18/23: Unmet; Continue   3.  Pt will score >/= 47 on FOTO to demonstrate improvement in perceived bilat Knee function.    Goal status: INITIAL Goal 05/18/23: unmet; continue   4.  Pt will be able to demonstrate/report ability to walk > 50 mins  in order to demonstrate functional improvement and tolerance to exercise and community mobility.   Goal status: INITIAL goal re-established on 4/25 Goal 05/18/23: MET  5. Pt will be able to transfer sit to stand at work after sitting x 1 hour with pain </= 5/10  Initial 05/18/23: 9/10         PLAN: PT FREQUENCY: 1-2x/week   PT DURATION: 6 weeks   PLANNED INTERVENTIONS: Therapeutic exercises, Therapeutic activity, Neuromuscular re-education, Balance training, Gait training, Patient/Family education, Self Care, Joint mobilization, Joint  manipulation, Stair training, Orthotic/Fit training, Aquatic Therapy, Dry Needling, Electrical stimulation, Spinal manipulation, Spinal mobilization, Cryotherapy, Moist heat, Taping, Traction, Biofeedback, Ionotophoresis 4mg /ml Dexamethasone, Manual therapy, and Re-evaluation   PLAN FOR NEXT SESSION: what did Dr. Steward Drone say from [facility] MD appt? Continue painfree bil LE strengthening    [facility] Outpatient Rehabilitation at [facility] [address] [place], [place], [address] Phone: [phone]   Fax:  [phone]  Patient Details  Name: Tracy Mcpherson MRN: [id number] Date of Birth: 08-05-1971 Referring Provider:  Amador Cunas, [place]*  Encounter Date: 06/15/2023      [facility] Outpatient Rehabilitation at [facility] [address] [place], [place], [address] Phone: [phone]   Fax:  [phone]

## 2023-06-17 ENCOUNTER — Ambulatory Visit (INDEPENDENT_AMBULATORY_CARE_PROVIDER_SITE_OTHER): Payer: BC Managed Care – PPO | Admitting: Orthopaedic Surgery

## 2023-06-17 ENCOUNTER — Telehealth (HOSPITAL_BASED_OUTPATIENT_CLINIC_OR_DEPARTMENT_OTHER): Payer: Self-pay | Admitting: Orthopaedic Surgery

## 2023-06-17 DIAGNOSIS — M25562 Pain in left knee: Secondary | ICD-10-CM | POA: Diagnosis not present

## 2023-06-17 DIAGNOSIS — G8929 Other chronic pain: Secondary | ICD-10-CM

## 2023-06-17 DIAGNOSIS — M25561 Pain in right knee: Secondary | ICD-10-CM

## 2023-06-17 NOTE — Telephone Encounter (Signed)
HA injections

## 2023-06-17 NOTE — Progress Notes (Signed)
Chief Complaint: Knee pain     History of Present Illness:   06/17/2023: Today with bilateral persistent knee pain.  She does feel like the right knee is occasionally popping and needs to be readjusted.  She does feel like physical therapy is improving and her strength is improving dramatically although there does continue to be right-sided mechanical type symptoms particularly about the patella.  Tracy Mcpherson is a 52 y.o. female presents today with bilateral knee pain right worse than left.  This is been really over the last several years although it has been getting quite painful and tight since October.  There is pain predominantly medially.  The right is worse than the left.  She has difficulty time walking and going to [place].  She notes that this has become increasingly painful since she began a sedentary job.  She is experiencing popping.  She does feel like the knee is giving out as well.    Surgical History:   None  PMH/PSH/Family History/Social History/Meds/Allergies:    Past Medical History:  Diagnosis Date   Anemia    Anxiety    Asthma    Constipation    Epilepsy (HCC)    Fibroids    History of gestational diabetes    History of herpes simplex type 2 infection    History of pregnancy induced hypertension    Hx of leukocytosis    mild   Iron deficiency anemia    Joint pain    Lower extremity edema    Migraines    Morbid obesity (HCC)    Pain in both lower legs    Plantar fasciitis    Prediabetes    Right knee pain    Seasonal allergies    Seizures (HCC)    Sleep apnea    Vitamin B 12 deficiency    Vitamin D deficiency    Past Surgical History:  Procedure Laterality Date   CESAREAN SECTION  11-02-2007   CRANIOTOMY FOR TEMPORAL LOBECTOMY     GYNECOLOGIC CRYOSURGERY     IUD REMOVAL  12-16-2009   REMOVAL THYROID CYST  2000   Social History   Socioeconomic History   Marital status: Legally Separated    Spouse name:  Not on file   Number of children: 1   Years of education: Not on file   Highest education level: Not on file  Occupational History   Occupation: Consulting civil engineer  Tobacco Use   Smoking status: Never   Smokeless tobacco: Never  Vaping Use   Vaping status: Never Used  Substance and Sexual Activity   Alcohol use: No   Drug use: No   Sexual activity: Not Currently    Birth control/protection: None  Other Topics Concern   Not on file  Social History Narrative   Not on file   Social Determinants of Health   Financial Resource Strain: Not on file  Food Insecurity: No Food Insecurity (04/20/2022)   Hunger Vital Sign    Worried About Running Out of Food in the Last Year: Never true    Ran Out of Food in the Last Year: Never true  Transportation Needs: No Transportation Needs (04/20/2022)   PRAPARE - Administrator, Civil Service (Medical): No    Lack of Transportation (Non-Medical): No  Physical Activity: Not  on file  Stress: Not on file  Social Connections: Unknown (03/04/2023)   Received from [facility]   Social Network    Social Network: Not on file   Family History  Problem Relation Age of Onset   Hypertension Mother    Obesity Mother    Fibroids Mother    Hypertension Sister    Cancer Maternal Uncle        lung   Cancer Maternal Grandmother        lung   Kidney disease Maternal Grandfather    Alzheimer's disease Paternal Grandmother    Allergies  Allergen Reactions   Flexeril [Cyclobenzaprine Hcl] Hives   Lamictal [Lamotrigine] Other (See Comments)    Muscles  Tightened.   Morphine And Codeine Hives   Percocet [Oxycodone-Acetaminophen] Hives   Doxycycline Hives   Topiramate Other (See Comments)    Affecting her thinking   Vimpat [Lacosamide]     Muscle tightness    Current Outpatient Medications  Medication Sig Dispense Refill   albuterol (VENTOLIN HFA) 108 (90 Base) MCG/ACT inhaler Inhale into the lungs.     ASHWAGANDHA PO Take by mouth  daily. (Patient not taking: Reported on 03/30/2023)     Azelastine HCl 137 MCG/SPRAY SOLN Place 1 spray into both nostrils 2 (two) times daily.     diclofenac (VOLTAREN) 75 MG EC tablet Take 1 tablet (75 mg total) by mouth 2 (two) times daily as needed. 60 tablet 3   diclofenac (VOLTAREN) 75 MG EC tablet Take 1 tablet (75 mg total) by mouth 2 (two) times daily. 30 tablet 3   diclofenac (VOLTAREN) 75 MG EC tablet TAKE 1 TABLET BY MOUTH TWICE A DAY 30 tablet 3   famotidine (PEPCID) 20 MG tablet Take 1 tablet (20 mg total) by mouth 2 (two) times daily. 60 tablet 0   fluocinonide ointment (LIDEX) 0.05 % Apply topically.     Iron-FA-B Cmp-C-Biot-Probiotic (FUSION PLUS) CAPS Take 1 capsule by mouth daily.     ketotifen (ZADITOR) 0.025 % ophthalmic solution 1 drop as needed. (Patient not taking: Reported on 03/30/2023)     Multiple Vitamin (MULTIVITAMIN WITH MINERALS) TABS tablet Take 1 tablet by mouth daily. (Patient not taking: Reported on 03/30/2023)     pantoprazole (PROTONIX) 40 MG tablet Take 1 tablet (40 mg total) by mouth daily. 30 tablet 0   triamcinolone ointment (KENALOG) 0.1 % SMARTSIG:Topical 4 Times a Week (Patient not taking: Reported on 03/30/2023)     Vitamin D, Ergocalciferol, (DRISDOL) 1.25 MG (50000 UNIT) CAPS capsule Take 1 capsule (50,000 Units total) by mouth every 3 (three) days. (Patient taking differently: Take 50,000 Units by mouth once a week.) 10 capsule 0   No current facility-administered medications for this visit.   No results found.  Review of Systems:   A ROS was performed including pertinent positives and negatives as documented in the HPI.  Physical Exam :   Constitutional: NAD and appears stated age Neurological: Alert and oriented Psych: Appropriate affect and cooperative Last menstrual period 10/13/2019.   Comprehensive Musculoskeletal Exam:    Tenderness palpation about the medial lateral joint lines on the right and left.  There is significant crepitus.   Range of motion is from 0 to 120 degrees on both sides.  Imaging:   Xray (4 views right knee, 4 views left knee): There is significant medial joint space narrowing of both although left worse than right with regard to the tibiofemoral joint spaces    I personally reviewed and  interpreted the radiographs.   Assessment:   52 y.o. 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  She has not had injections a total of 2 times into both knees and I did advise against persistent injections at such a young age due to risk of concern for progression of arthritis.  At this time I did describe that hyaluronic acid may be the last detrimental from a chondral perspective.  I did also advise on patella bracing and sleeve usage.  She will obtain these bilaterally.  I will plan to see her back for bilateral gel injections pending insurance authorization Plan :    -Return to clinic following insurance authorization for hyaluronic acid injections bilateral knees      I personally saw and evaluated the patient, and participated in the management and treatment plan.  Huel Cote, MD Attending Physician, Orthopedic Surgery  This document was dictated using Dragon voice recognition software. A reasonable attempt at proof reading has been made to minimize errors.

## 2023-06-18 ENCOUNTER — Encounter: Payer: Self-pay | Admitting: Hematology and Oncology

## 2023-06-18 NOTE — Telephone Encounter (Signed)
VOB submitted for Euflexxa, bilateral knee.  

## 2023-06-20 DIAGNOSIS — F4321 Adjustment disorder with depressed mood: Secondary | ICD-10-CM | POA: Diagnosis not present

## 2023-06-26 ENCOUNTER — Other Ambulatory Visit: Payer: Self-pay

## 2023-06-26 DIAGNOSIS — G8929 Other chronic pain: Secondary | ICD-10-CM

## 2023-07-04 DIAGNOSIS — M5417 Radiculopathy, lumbosacral region: Secondary | ICD-10-CM | POA: Diagnosis not present

## 2023-07-04 DIAGNOSIS — M9903 Segmental and somatic dysfunction of lumbar region: Secondary | ICD-10-CM | POA: Diagnosis not present

## 2023-07-04 DIAGNOSIS — M9904 Segmental and somatic dysfunction of sacral region: Secondary | ICD-10-CM | POA: Diagnosis not present

## 2023-07-04 DIAGNOSIS — M9905 Segmental and somatic dysfunction of pelvic region: Secondary | ICD-10-CM | POA: Diagnosis not present

## 2023-07-04 DIAGNOSIS — M9902 Segmental and somatic dysfunction of thoracic region: Secondary | ICD-10-CM | POA: Diagnosis not present

## 2023-07-06 ENCOUNTER — Telehealth (HOSPITAL_BASED_OUTPATIENT_CLINIC_OR_DEPARTMENT_OTHER): Payer: Self-pay | Admitting: Physical Therapy

## 2023-07-06 ENCOUNTER — Ambulatory Visit (HOSPITAL_BASED_OUTPATIENT_CLINIC_OR_DEPARTMENT_OTHER): Payer: BC Managed Care – PPO | Admitting: Physical Therapy

## 2023-07-06 NOTE — Telephone Encounter (Signed)
Patient no show, spoke with patient regarding missed appointment and reminded her of next appointment.   11:25 AM, 07/06/23 Wyman Songster PT, DPT Physical Therapist at [facility]

## 2023-07-12 ENCOUNTER — Ambulatory Visit (HOSPITAL_BASED_OUTPATIENT_CLINIC_OR_DEPARTMENT_OTHER): Payer: BC Managed Care – PPO | Admitting: Orthopaedic Surgery

## 2023-07-12 DIAGNOSIS — G8929 Other chronic pain: Secondary | ICD-10-CM

## 2023-07-12 DIAGNOSIS — M25562 Pain in left knee: Secondary | ICD-10-CM | POA: Diagnosis not present

## 2023-07-12 DIAGNOSIS — M25561 Pain in right knee: Secondary | ICD-10-CM

## 2023-07-12 DIAGNOSIS — M5417 Radiculopathy, lumbosacral region: Secondary | ICD-10-CM | POA: Diagnosis not present

## 2023-07-12 DIAGNOSIS — M9903 Segmental and somatic dysfunction of lumbar region: Secondary | ICD-10-CM | POA: Diagnosis not present

## 2023-07-12 DIAGNOSIS — M9904 Segmental and somatic dysfunction of sacral region: Secondary | ICD-10-CM | POA: Diagnosis not present

## 2023-07-12 DIAGNOSIS — M9905 Segmental and somatic dysfunction of pelvic region: Secondary | ICD-10-CM | POA: Diagnosis not present

## 2023-07-12 MED ORDER — TRIAMCINOLONE ACETONIDE 40 MG/ML IJ SUSP
80.0000 mg | INTRAMUSCULAR | Status: AC | PRN
Start: 2023-07-12 — End: 2023-07-12
  Administered 2023-07-12: 80 mg via INTRA_ARTICULAR

## 2023-07-12 MED ORDER — LIDOCAINE HCL 1 % IJ SOLN
4.0000 mL | INTRAMUSCULAR | Status: AC | PRN
Start: 2023-07-12 — End: 2023-07-12
  Administered 2023-07-12: 4 mL

## 2023-07-12 NOTE — Progress Notes (Signed)
Chief Complaint: Knee pain     History of Present Illness:   07/12/2023: Presents today for bilateral Euflexxa injections.  She has recently been able to begin the weight loss program at [facility] which is going quite well.  Tracy Mcpherson is a 52 y.o. female presents today with bilateral knee pain right worse than left.  This is been really over the last several years although it has been getting quite painful and tight since October.  There is pain predominantly medially.  The right is worse than the left.  She has difficulty time walking and going to [place].  She notes that this has become increasingly painful since she began a sedentary job.  She is experiencing popping.  She does feel like the knee is giving out as well.    Surgical History:   None  PMH/PSH/Family History/Social History/Meds/Allergies:    Past Medical History:  Diagnosis Date  . Anemia   . Anxiety   . Asthma   . Constipation   . Epilepsy (HCC)   . Fibroids   . History of gestational diabetes   . History of herpes simplex type 2 infection   . History of pregnancy induced hypertension   . Hx of leukocytosis    mild  . Iron deficiency anemia   . Joint pain   . Lower extremity edema   . Migraines   . Morbid obesity (HCC)   . Pain in both lower legs   . Plantar fasciitis   . Prediabetes   . Right knee pain   . Seasonal allergies   . Seizures (HCC)   . Sleep apnea   . Vitamin B 12 deficiency   . Vitamin D deficiency    Past Surgical History:  Procedure Laterality Date  . CESAREAN SECTION  11-02-2007  . CRANIOTOMY FOR TEMPORAL LOBECTOMY    . GYNECOLOGIC CRYOSURGERY    . IUD REMOVAL  12-16-2009  . REMOVAL THYROID CYST  2000   Social History   Socioeconomic History  . Marital status: Legally Separated    Spouse name: Not on file  . Number of children: 1  . Years of education: Not on file  . Highest education level: Not on file  Occupational History  .  Occupation: Consulting civil engineer  Tobacco Use  . Smoking status: Never  . Smokeless tobacco: Never  Vaping Use  . Vaping status: Never Used  Substance and Sexual Activity  . Alcohol use: No  . Drug use: No  . Sexual activity: Not Currently    Birth control/protection: None  Other Topics Concern  . Not on file  Social History Narrative  . Not on file   Social Determinants of Health   Financial Resource Strain: Not on file  Food Insecurity: No Food Insecurity (04/20/2022)   Hunger Vital Sign   . Worried About Programme researcher, broadcasting/film/video in the Last Year: Never true   . Ran Out of Food in the Last Year: Never true  Transportation Needs: No Transportation Needs (04/20/2022)   PRAPARE - Transportation   . Lack of Transportation (Medical): No   . Lack of Transportation (Non-Medical): No  Physical Activity: Not on file  Stress: Not on file  Social Connections: Unknown (03/04/2023)   Received from [facility]   Social Network   . Social  Network: Not on file   Family History  Problem Relation Age of Onset  . Hypertension Mother   . Obesity Mother   . Fibroids Mother   . Hypertension Sister   . Cancer Maternal Uncle        lung  . Cancer Maternal Grandmother        lung  . Kidney disease Maternal Grandfather   . Alzheimer's disease Paternal Grandmother    Allergies  Allergen Reactions  . Flexeril [Cyclobenzaprine Hcl] Hives  . Lamictal [Lamotrigine] Other (See Comments)    Muscles  Tightened.  . Morphine And Codeine Hives  . Percocet [Oxycodone-Acetaminophen] Hives  . Doxycycline Hives  . Topiramate Other (See Comments)    Affecting her thinking  . Vimpat [Lacosamide]     Muscle tightness    Current Outpatient Medications  Medication Sig Dispense Refill  . albuterol (VENTOLIN HFA) 108 (90 Base) MCG/ACT inhaler Inhale into the lungs.    . ASHWAGANDHA PO Take by mouth daily. (Patient not taking: Reported on 03/30/2023)    . Azelastine HCl 137 MCG/SPRAY SOLN Place 1 spray  into both nostrils 2 (two) times daily.    . diclofenac (VOLTAREN) 75 MG EC tablet Take 1 tablet (75 mg total) by mouth 2 (two) times daily as needed. 60 tablet 3  . diclofenac (VOLTAREN) 75 MG EC tablet Take 1 tablet (75 mg total) by mouth 2 (two) times daily. 30 tablet 3  . diclofenac (VOLTAREN) 75 MG EC tablet TAKE 1 TABLET BY MOUTH TWICE A DAY 30 tablet 3  . famotidine (PEPCID) 20 MG tablet Take 1 tablet (20 mg total) by mouth 2 (two) times daily. 60 tablet 0  . fluocinonide ointment (LIDEX) 0.05 % Apply topically.    . Iron-FA-B Cmp-C-Biot-Probiotic (FUSION PLUS) CAPS Take 1 capsule by mouth daily.    Marland Kitchen ketotifen (ZADITOR) 0.025 % ophthalmic solution 1 drop as needed. (Patient not taking: Reported on 03/30/2023)    . Multiple Vitamin (MULTIVITAMIN WITH MINERALS) TABS tablet Take 1 tablet by mouth daily. (Patient not taking: Reported on 03/30/2023)    . pantoprazole (PROTONIX) 40 MG tablet Take 1 tablet (40 mg total) by mouth daily. 30 tablet 0  . triamcinolone ointment (KENALOG) 0.1 % SMARTSIG:Topical 4 Times a Week (Patient not taking: Reported on 03/30/2023)    . Vitamin D, Ergocalciferol, (DRISDOL) 1.25 MG (50000 UNIT) CAPS capsule Take 1 capsule (50,000 Units total) by mouth every 3 (three) days. (Patient taking differently: Take 50,000 Units by mouth once a week.) 10 capsule 0   No current facility-administered medications for this visit.   No results found.  Review of Systems:   A ROS was performed including pertinent positives and negatives as documented in the HPI.  Physical Exam :   Constitutional: NAD and appears stated age Neurological: Alert and oriented Psych: Appropriate affect and cooperative Last menstrual period 10/13/2019.   Comprehensive Musculoskeletal Exam:    Tenderness palpation about the medial lateral joint lines on the right and left.  There is significant crepitus.  Range of motion is from 0 to 120 degrees on both sides.  Imaging:   Xray (4 views right  knee, 4 views left knee): There is significant medial joint space narrowing of both although left worse than right with regard to the tibiofemoral joint spaces    I personally reviewed and interpreted the radiographs.   Assessment:   52 y.o. 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  Presents today for bilateral  Euflexxa injections.  These were performed after verbal consent was obtained Plan :    -Bilateral knee injections performed after verbal consent obtained     Procedure Note  Patient: Tracy Mcpherson             Date of Birth: 1971-07-08           MRN: [id number]             Visit Date: 07/12/2023  Procedures: Visit Diagnoses: No diagnosis found.  Large Joint Inj: R knee on 07/12/2023 3:51 PM Indications: pain Details: 22 G 1.5 in needle, ultrasound-guided anterior approach  Arthrogram: No  Medications: 4 mL lidocaine 1 %; 80 mg triamcinolone acetonide 40 MG/ML Outcome: tolerated well, no immediate complications Procedure, treatment alternatives, risks and benefits explained, specific risks discussed. Consent was given by the patient. Immediately prior to procedure a time out was called to verify the correct patient, procedure, equipment, support staff and site/side marked as required. Patient was prepped and draped in the usual sterile fashion.    Large Joint Inj: L knee on 07/12/2023 3:51 PM Indications: pain Details: 22 G 1.5 in needle, ultrasound-guided anterior approach  Arthrogram: No  Medications: 4 mL lidocaine 1 %; 80 mg triamcinolone acetonide 40 MG/ML Outcome: tolerated well, no immediate complications Procedure, treatment alternatives, risks and benefits explained, specific risks discussed. Consent was given by the patient. Immediately prior to procedure a time out was called to verify the correct patient, procedure, equipment, support staff and site/side marked as required. Patient was prepped and draped in the usual  sterile fashion.       I personally saw and evaluated the patient, and participated in the management and treatment plan.  Huel Cote, MD Attending Physician, Orthopedic Surgery  This document was dictated using Dragon voice recognition software. A reasonable attempt at proof reading has been made to minimize errors.

## 2023-07-13 ENCOUNTER — Ambulatory Visit (HOSPITAL_BASED_OUTPATIENT_CLINIC_OR_DEPARTMENT_OTHER): Payer: BC Managed Care – PPO | Admitting: Physical Therapy

## 2023-07-15 DIAGNOSIS — F4321 Adjustment disorder with depressed mood: Secondary | ICD-10-CM | POA: Diagnosis not present

## 2023-07-19 ENCOUNTER — Ambulatory Visit (HOSPITAL_BASED_OUTPATIENT_CLINIC_OR_DEPARTMENT_OTHER): Payer: BC Managed Care – PPO | Admitting: Orthopaedic Surgery

## 2023-07-19 DIAGNOSIS — M9903 Segmental and somatic dysfunction of lumbar region: Secondary | ICD-10-CM | POA: Diagnosis not present

## 2023-07-19 DIAGNOSIS — M25562 Pain in left knee: Secondary | ICD-10-CM

## 2023-07-19 DIAGNOSIS — G8929 Other chronic pain: Secondary | ICD-10-CM

## 2023-07-19 DIAGNOSIS — M25561 Pain in right knee: Secondary | ICD-10-CM | POA: Diagnosis not present

## 2023-07-19 DIAGNOSIS — M5417 Radiculopathy, lumbosacral region: Secondary | ICD-10-CM | POA: Diagnosis not present

## 2023-07-19 DIAGNOSIS — M9904 Segmental and somatic dysfunction of sacral region: Secondary | ICD-10-CM | POA: Diagnosis not present

## 2023-07-19 DIAGNOSIS — M9905 Segmental and somatic dysfunction of pelvic region: Secondary | ICD-10-CM | POA: Diagnosis not present

## 2023-07-19 MED ORDER — TRIAMCINOLONE ACETONIDE 40 MG/ML IJ SUSP
80.0000 mg | INTRAMUSCULAR | Status: AC | PRN
Start: 2023-07-19 — End: 2023-07-19
  Administered 2023-07-19: 80 mg via INTRA_ARTICULAR

## 2023-07-19 MED ORDER — LIDOCAINE HCL 1 % IJ SOLN
4.0000 mL | INTRAMUSCULAR | Status: AC | PRN
Start: 2023-07-19 — End: 2023-07-19
  Administered 2023-07-19: 4 mL

## 2023-07-19 NOTE — Progress Notes (Signed)
Chief Complaint: Knee pain     History of Present Illness:   07/19/2023: Presents today for bilateral Euflexxa injections.  She has recently been able to begin the weight loss program at [facility] which is going quite well.  Tracy Mcpherson is a 52 y.o. female presents today with bilateral knee pain right worse than left.  This is been really over the last several years although it has been getting quite painful and tight since October.  There is pain predominantly medially.  The right is worse than the left.  She has difficulty time walking and going to [place].  She notes that this has become increasingly painful since she began a sedentary job.  She is experiencing popping.  She does feel like the knee is giving out as well.    Surgical History:   None  PMH/PSH/Family History/Social History/Meds/Allergies:    Past Medical History:  Diagnosis Date  . Anemia   . Anxiety   . Asthma   . Constipation   . Epilepsy (HCC)   . Fibroids   . History of gestational diabetes   . History of herpes simplex type 2 infection   . History of pregnancy induced hypertension   . Hx of leukocytosis    mild  . Iron deficiency anemia   . Joint pain   . Lower extremity edema   . Migraines   . Morbid obesity (HCC)   . Pain in both lower legs   . Plantar fasciitis   . Prediabetes   . Right knee pain   . Seasonal allergies   . Seizures (HCC)   . Sleep apnea   . Vitamin B 12 deficiency   . Vitamin D deficiency    Past Surgical History:  Procedure Laterality Date  . CESAREAN SECTION  11-02-2007  . CRANIOTOMY FOR TEMPORAL LOBECTOMY    . GYNECOLOGIC CRYOSURGERY    . IUD REMOVAL  12-16-2009  . REMOVAL THYROID CYST  2000   Social History   Socioeconomic History  . Marital status: Legally Separated    Spouse name: Not on file  . Number of children: 1  . Years of education: Not on file  . Highest education level: Not on file  Occupational History  .  Occupation: Consulting civil engineer  Tobacco Use  . Smoking status: Never  . Smokeless tobacco: Never  Vaping Use  . Vaping status: Never Used  Substance and Sexual Activity  . Alcohol use: No  . Drug use: No  . Sexual activity: Not Currently    Birth control/protection: None  Other Topics Concern  . Not on file  Social History Narrative  . Not on file   Social Determinants of Health   Financial Resource Strain: Not on file  Food Insecurity: No Food Insecurity (04/20/2022)   Hunger Vital Sign   . Worried About Programme researcher, broadcasting/film/video in the Last Year: Never true   . Ran Out of Food in the Last Year: Never true  Transportation Needs: No Transportation Needs (04/20/2022)   PRAPARE - Transportation   . Lack of Transportation (Medical): No   . Lack of Transportation (Non-Medical): No  Physical Activity: Not on file  Stress: Not on file  Social Connections: Unknown (03/04/2023)   Received from [facility]   Social Network   . Social  Network: Not on file   Family History  Problem Relation Age of Onset  . Hypertension Mother   . Obesity Mother   . Fibroids Mother   . Hypertension Sister   . Cancer Maternal Uncle        lung  . Cancer Maternal Grandmother        lung  . Kidney disease Maternal Grandfather   . Alzheimer's disease Paternal Grandmother    Allergies  Allergen Reactions  . Flexeril [Cyclobenzaprine Hcl] Hives  . Lamictal [Lamotrigine] Other (See Comments)    Muscles  Tightened.  . Morphine And Codeine Hives  . Percocet [Oxycodone-Acetaminophen] Hives  . Doxycycline Hives  . Topiramate Other (See Comments)    Affecting her thinking  . Vimpat [Lacosamide]     Muscle tightness    Current Outpatient Medications  Medication Sig Dispense Refill  . albuterol (VENTOLIN HFA) 108 (90 Base) MCG/ACT inhaler Inhale into the lungs.    . ASHWAGANDHA PO Take by mouth daily. (Patient not taking: Reported on 03/30/2023)    . Azelastine HCl 137 MCG/SPRAY SOLN Place 1 spray  into both nostrils 2 (two) times daily.    . diclofenac (VOLTAREN) 75 MG EC tablet Take 1 tablet (75 mg total) by mouth 2 (two) times daily as needed. 60 tablet 3  . diclofenac (VOLTAREN) 75 MG EC tablet Take 1 tablet (75 mg total) by mouth 2 (two) times daily. 30 tablet 3  . diclofenac (VOLTAREN) 75 MG EC tablet TAKE 1 TABLET BY MOUTH TWICE A DAY 30 tablet 3  . famotidine (PEPCID) 20 MG tablet Take 1 tablet (20 mg total) by mouth 2 (two) times daily. 60 tablet 0  . fluocinonide ointment (LIDEX) 0.05 % Apply topically.    . Iron-FA-B Cmp-C-Biot-Probiotic (FUSION PLUS) CAPS Take 1 capsule by mouth daily.    Marland Kitchen ketotifen (ZADITOR) 0.025 % ophthalmic solution 1 drop as needed. (Patient not taking: Reported on 03/30/2023)    . Multiple Vitamin (MULTIVITAMIN WITH MINERALS) TABS tablet Take 1 tablet by mouth daily. (Patient not taking: Reported on 03/30/2023)    . pantoprazole (PROTONIX) 40 MG tablet Take 1 tablet (40 mg total) by mouth daily. 30 tablet 0  . triamcinolone ointment (KENALOG) 0.1 % SMARTSIG:Topical 4 Times a Week (Patient not taking: Reported on 03/30/2023)    . Vitamin D, Ergocalciferol, (DRISDOL) 1.25 MG (50000 UNIT) CAPS capsule Take 1 capsule (50,000 Units total) by mouth every 3 (three) days. (Patient taking differently: Take 50,000 Units by mouth once a week.) 10 capsule 0   No current facility-administered medications for this visit.   No results found.  Review of Systems:   A ROS was performed including pertinent positives and negatives as documented in the HPI.  Physical Exam :   Constitutional: NAD and appears stated age Neurological: Alert and oriented Psych: Appropriate affect and cooperative Last menstrual period 10/13/2019.   Comprehensive Musculoskeletal Exam:    Tenderness palpation about the medial lateral joint lines on the right and left.  There is significant crepitus.  Range of motion is from 0 to 120 degrees on both sides.  Imaging:   Xray (4 views right  knee, 4 views left knee): There is significant medial joint space narrowing of both although left worse than right with regard to the tibiofemoral joint spaces    I personally reviewed and interpreted the radiographs.   Assessment:   52 y.o. 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  Presents today for bilateral  Euflexxa injections.  These were performed after verbal consent was obtained Plan :    -Bilateral knee injections performed after verbal consent obtained     Procedure Note  Patient: Tracy Mcpherson             Date of Birth: 07/25/1971           MRN: [id number]             Visit Date: 07/19/2023  Procedures: Visit Diagnoses: No diagnosis found.  Large Joint Inj: R knee on 07/19/2023 3:37 PM Indications: pain Details: 22 G 1.5 in needle, ultrasound-guided anterior approach  Arthrogram: No  Medications: 4 mL lidocaine 1 %; 80 mg triamcinolone acetonide 40 MG/ML Outcome: tolerated well, no immediate complications Procedure, treatment alternatives, risks and benefits explained, specific risks discussed. Consent was given by the patient. Immediately prior to procedure a time out was called to verify the correct patient, procedure, equipment, support staff and site/side marked as required. Patient was prepped and draped in the usual sterile fashion.    Large Joint Inj: L knee on 07/19/2023 3:37 PM Indications: pain Details: 22 G 1.5 in needle, ultrasound-guided anterior approach  Arthrogram: No  Medications: 4 mL lidocaine 1 %; 80 mg triamcinolone acetonide 40 MG/ML Outcome: tolerated well, no immediate complications Procedure, treatment alternatives, risks and benefits explained, specific risks discussed. Consent was given by the patient. Immediately prior to procedure a time out was called to verify the correct patient, procedure, equipment, support staff and site/side marked as required. Patient was prepped and draped in the usual  sterile fashion.       I personally saw and evaluated the patient, and participated in the management and treatment plan.  Huel Cote, MD Attending Physician, Orthopedic Surgery  This document was dictated using Dragon voice recognition software. A reasonable attempt at proof reading has been made to minimize errors.

## 2023-07-20 ENCOUNTER — Ambulatory Visit (HOSPITAL_BASED_OUTPATIENT_CLINIC_OR_DEPARTMENT_OTHER): Payer: BC Managed Care – PPO | Admitting: Rehabilitative and Restorative Service Providers"

## 2023-07-20 ENCOUNTER — Encounter (HOSPITAL_BASED_OUTPATIENT_CLINIC_OR_DEPARTMENT_OTHER): Payer: Self-pay

## 2023-07-20 NOTE — Therapy (Deleted)
OUTPATIENT PHYSICAL THERAPY LOWER EXTREMITY TREATMENT   Patient Name: Tracy Mcpherson MRN: [id number] DOB:1971/09/29, 52 y.o., female Today's Date: 07/20/2023  END OF SESSION:       Past Medical History:  Diagnosis Date   Anemia    Anxiety    Asthma    Constipation    Epilepsy (HCC)    Fibroids    History of gestational diabetes    History of herpes simplex type 2 infection    History of pregnancy induced hypertension    Hx of leukocytosis    mild   Iron deficiency anemia    Joint pain    Lower extremity edema    Migraines    Morbid obesity (HCC)    Pain in both lower legs    Plantar fasciitis    Prediabetes    Right knee pain    Seasonal allergies    Seizures (HCC)    Sleep apnea    Vitamin B 12 deficiency    Vitamin D deficiency    Past Surgical History:  Procedure Laterality Date   CESAREAN SECTION  11-02-2007   CRANIOTOMY FOR TEMPORAL LOBECTOMY     GYNECOLOGIC CRYOSURGERY     IUD REMOVAL  12-16-2009   REMOVAL THYROID CYST  2000   Patient Active Problem List   Diagnosis Date Noted   Alopecia 05/31/2022   Postmenopausal bleeding 05/31/2022   Asthma without status asthmaticus 05/31/2022   Gastroesophageal reflux disease 05/31/2022   Common migraine 05/31/2022   Sacroiliac joint dysfunction of right side 11/10/2021   Plantar fasciitis, bilateral 11/10/2021   Patellofemoral pain syndrome of both knees 08/21/2021   Vitamin D deficiency 05/12/2020   Prediabetes 05/12/2020   Class 3 severe obesity with serious comorbidity and body mass index (BMI) of 50.0 to 59.9 in adult [facility]) 05/12/2020   Unspecified deficiency anemia 02/20/2012   Seizure disorder (HCC) 02/20/2012   Leucocytosis 02/20/2012    PCP: Mila Palmer, MD   REFERRING PROVIDER:   Huel Cote, MD    REFERRING DIAG:  Diagnosis  M25.561,G89.29 (ICD-10-CM) - Chronic pain of right knee  M25.562,G89.29 (ICD-10-CM) - Chronic pain of left knee    THERAPY DIAG:  No diagnosis  found.  Rationale for Evaluation and Treatment: Rehabilitation  ONSET DATE: 10/23  SUBJECTIVE:   SUBJECTIVE STATEMENT: When I stand up at my desk at work, I have a lot of pain.      PERTINENT HISTORY: Asthma, hx of seizures (none since 2013), sleep apnea, anemia, anxiety, prediabetes, plantar fascitis.   PAIN:  Are you having pain? Yes: NPRS scale: 1/10 Pain location: R knee medially; L knee lateral and posteriorly Pain description: Burning, sharp, tight Aggravating factors: sitting> 30 minutes, ascending/descending stairs, getting out of a chair, standing > 15 minutes Relieving factors:  pain medication, heating pad, movement/exercise    PRECAUTIONS: None   WEIGHT BEARING RESTRICTIONS No   FALLS:  Has patient fallen in last 6 months? No   LIVING ENVIRONMENT: Lives with: lives with their family Lives in: House/apartment Stairs: No Has following equipment at home: None   OCCUPATION: Hotel manager; does travel for work    Hobbies:  Wellsite geologist to walk  [employer] to [employer]    PLOF: Independent   PATIENT GOALS: able to do things with her son; be able to go on vacation     OBJECTIVE:   DIAGNOSTIC FINDINGS:  R knee x ray  IMPRESSION: Moderate tricompartmental osteoarthritis, most prominent in the medial tibiofemoral compartment.  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 Small joint effusion.    PATIENT SURVEYS:  FOTO 31  55 @ DC  19 pts MCII   COGNITION: Overall cognitive status: Within functional limits for tasks assessed     SENSATION: WFL   MUSCLE LENGTH: Positive Ely's Positive HS 90/90  POSTURE: No Significant postural limitations  PALPATION: TTP bilat knee joint lines; medial on R; more lateral pain on L; patellar tendon sensitivity ( potentially more sensitive on medial right knee on re-assessment)   LOWER EXTREMITY ROM: moderately limited at bilateral knees  due to joint stiffness, pain, and body habitus; hip ROM deferred today   LOWER EXTREMITY MMT: SECOND MEASUREMENT RECERT MEASUREMENT 05/18/23  MMT Right eval Left eval  Hip flexion 27.9/20.3 27.8/22.9  Hip extension    Hip abduction 15.9/16.4 20.1/16.0  Hip adduction    Hip internal rotation    Hip external rotation    Knee flexion    Knee extension 23.5/ 24.2 16.0/20.7   (Blank rows = not tested)  Pain with end range active and passive flexion   FUNCTIONAL TESTS:    GAIT: Distance walked: 253ft Assistive device utilized: None Level of assistance: Complete Independence Comments: bilat Trendelenberg; progressively worsens with duration of walking ( same on 2nd eval 5/25)    TODAY'S TREATMENT:                                                                                                                              DATE:   [facility] Adult PT Treatment:                                                DATE: 07/20/23 Therapeutic Exercise: *** Manual Therapy: *** Neuromuscular re-ed: *** Therapeutic Activity: *** Modalities: *** Self Care: ***   06/15/23: Ball squeeze with bridge x 20 while discussing 90-90-90 positioning at desk at work and getting a mat for underneath chair with wheels so it doesn't roll Ball squeeze with iso bridge and cane shoulder press x 20 Ball squeeze with alternating LAQ x 20 SLR 4 lbs x 20 SLR/hip abduction combo lbs x 20 Standing 4 lb hamstring curls x 20 Standing 4 lb hip abduction x 20 Standing 4 lb hip abduction/extension combo x 20 Standing hip flexor/quad stretch x 30 sec No complaints of pain after tx. All performed bil  06/08/23:  R hamstring dig x 20 with 3 sec hold R bridge x 20  L hamstring stretch/Piriformis stretch x 30 sec each Prone hip ext with hip abdct/addct combo x 20 unilaterally and then bilaterally Alternating donkey kick x 20 prone Prone bil hip ext with hip abduct/addct combo x 20  Unilateral donkey kick x 20 performed  bil 6" lateral step ups x 20 Seated 5 lb unilateral LAQ x 20  Ball squeeze with bil LAQ x 20 Ball squeeze with STS x 15 No complaints of pain after tx.   [facility] Adult PT Treatment:                                                DATE: 05/18/23 Therapeutic Exercise: Quad sets x 15 R SLR x 15 R Sidelying R clam shell x 20 with 2-3 sec hold Sidelying R hip abduction x 20 with 2-3 sec hold Sidelying combo: hip flexion/hip abduction/hip ext x 20 in ER R Prone 5 lb hamstring curl unilaterally bil x 20 each Prone hip ext without weight with glute set x 20 each LE Donkey kick x 20 each LE All with PT palpation for proper muscle contraction and modifications as needed for discomfort or technique  Therapeutic Activity: Discussed concerns over swelling; discussed benefits of thigh TED hose for swelling bil; recommended pt return to MD; issued directions for heating/icing as pt's bil lower extremities are swollen, especially superior to patella and around sock ribbing. Advised pt to go without socks if she doesn't have socks that more comfortably fit vs socks that increase swelling.Discussed water exercises beginning as pt has not performed water therex yet.   [facility] Adult PT Treatment:                                                DATE: 05/11/23 Therapeutic Exercise: Sidelying clam shell x 20 Sidelying combo: hip flexion/hip abduction/hip ext x 20 in ER Ball squeeze with bridge x 20 Prone 5 lb hamstring curl unilaterally bil x 20 each Prone hip ext without weight with glute set x 20 each but with increased difficulty on L LE due to weakness Prone donkey kick x 20 with glute set Prone heel squeeze x 20 with 2-3 sec hold Standing lunge limited AROM at sink countertop x 10 each side Standing calf raise at countertop x 20 Standing diagonal hip abdct/ext combo x 20 at countertop New HEP issued   [facility] Adult PT Treatment:                                                DATE: 04/20/23 Therapeutic  Exercise: Gait in the hallway as warmup x 2 min with PT looking for gait abnormalities Quad sets x 20 SLR x 20 VMO SLR x 20 Ball squeeze x 20 Ball squeeze with bridge x 20 Iso ball squeeze with bridge with bil shoulder flex/ext x 20 SAQ 2.5lbs x 20 SAQ 2.5lbs bil with ball squeeze x 20 HS digs x 20 with 2-3 sec hold Sidelying hip abdct x 20 2 lbs Hamstring stretch seated x 30 sec Butterfly stretch 2x30 sec All therex performed bil   [facility] Adult PT Treatment:                                                DATE: 04/06/23 Therapeutic Exercise: Quad set x 15 SLR x 15 VMO SLR x  15 S/L hip abduction x 15 Ball squeeze x 15 Ball squeeze with bridge x 15 SAQ 2 lbs unilat/bil x 15 each Seated Hamstring stretch 1 x 30 sec  Seated hip abduction with GTB x 10 Answered questions regarding [employer] gym equipment All therex performed bil   1/19  Access Code: [email or web address] URL: [email or web address] Date: 03/30/2023 Prepared by: Lorayne Bender  Exercises - Seated Hamstring Stretch  - 1 x daily - 7 x weekly - 3 sets - 3 reps - 20sec  hold - Supine Quad Set  - 3-4 x daily - 7 x weekly - 3 sets - 10 reps - 5 sec  hold - Active Straight Leg Raise with Quad Set  - 1 x daily - 7 x weekly - 3 sets - 10 reps - Seated Hip Abduction with Resistance  - 1 x daily - 7 x weekly - 3 sets - 10 reps  05/11/23 Access Code: [email or web address] URL: [email or web address] Date: 05/11/2023 Prepared by: Luna Fuse  Exercises - Sitting Knee Extension with Resistance  - 1 x daily - 7 x weekly - 3 sets - 10 reps - Seated Hamstring Curl with Anchored Resistance  - 1 x daily - 7 x weekly - 3 sets - 10 reps - Sit to Stand with Arms Crossed  - 1 x daily - 7 x weekly - 4 sets - 5 reps - Standing March with Counter Support  - 1 x daily - 3 x weekly - 1 sets - 10 reps - Standing Knee Flexion AROM with Chair Support  - 1 x daily - 3 x weekly - 1 sets - 10 reps - Standing Hip Extension with Counter  Support  - 1 x daily - 3 x weekly - 1 sets - 10 reps - Sidestepping  - 1 x daily - 3 x weekly - 3 sets - 10 reps   PATIENT EDUCATION:  Education details: MOI, diagnosis, prognosis, anatomy, exercise progression, DOMS expectations, muscle firing,  envelope of function, HEP, POC  Person educated: Patient Education method: Explanation, Demonstration, Tactile cues, Verbal cues, and Handouts Education comprehension: verbalized understanding, returned demonstration, verbal cues required, and tactile cues required  HOME EXERCISE PROGRAM:  Access Code: [email or web address] URL: [email or web address] Date: 05/11/23 Prepared [email or web address] Christell Constant  ASSESSMENT:  CLINICAL IMPRESSION: Pt reports continued knee pain in AM and upon standing from work chair. Discussed work station setup and needing a mat under wheeled chair. Pt states no other change in knee pain. Set to see MD on  Monday.   OBJECTIVE IMPAIRMENTS Abnormal gait, decreased activity tolerance, decreased balance, decreased endurance, decreased mobility, difficulty walking, decreased ROM, decreased strength, hypomobility, increased edema, impaired flexibility, improper body mechanics, postural dysfunction, obesity, and pain.    ACTIVITY LIMITATIONS carrying, lifting, bending, sitting, standing, squatting, and stairs   PARTICIPATION LIMITATIONS: cleaning, laundry, driving, shopping, community activity, occupation, and yard work   PERSONAL FACTORS Time since onset of injury/illness/exacerbation, 3+ comorbidities: obesity, OA, anxiety and plantar fascitis  are also affecting patient's functional outcome.    REHAB POTENTIAL: Good   CLINICAL DECISION MAKING: Low/stable   EVALUATION COMPLEXITY: low     GOALS: SHORT TERM GOALS: Target date: 06/15/23   Pt will become independent with HEP in order to demonstrate synthesis of PT education.   Goal status: INITIAL goal re-established on 4/25 Goal 05/18/23: MET   2.  Pt will score >/= 19 on FOTO  to demonstrate improvement in perceived L LE function.  goal re-established on 4/25   Goal status:  INITIAL Goal 05/18/23: Ongoing   3.  Pt will report at least 2 pt reduction on NPRS scale for pain during ADL in order to demonstrate functional improvement with household activity, self care, and ADL.  goal re-established on 4/25   Goal status: INITIAL Goal 05/18/23: 7/10    LONG TERM GOALS: Target date: 06/29/23     Pt  will become independent with final HEP in order to demonstrate synthesis of PT education.   goal re-established on 4/25 Goal status 05/18/23: MET   2.  Pt will be able to demonstrate ascending and descending stairs with reciprocal pattern in order to demonstrate functional improvement in LE function for self-care and house hold duties.   goal re-established on 4/25     Goal status: INITIAL Goal 05/18/23: Unmet; Continue   3.  Pt will score >/= 47 on FOTO to demonstrate improvement in perceived bilat Knee function.    Goal status: INITIAL Goal 05/18/23: unmet; continue   4.  Pt will be able to demonstrate/report ability to walk > 50 mins  in order to demonstrate functional improvement and tolerance to exercise and community mobility.   Goal status: INITIAL goal re-established on 4/25 Goal 05/18/23: MET  5. Pt will be able to transfer sit to stand at work after sitting x 1 hour with pain </= 5/10  Initial 05/18/23: 9/10         PLAN: PT FREQUENCY: 1-2x/week   PT DURATION: 6 weeks   PLANNED INTERVENTIONS: Therapeutic exercises, Therapeutic activity, Neuromuscular re-education, Balance training, Gait training, Patient/Family education, Self Care, Joint mobilization, Joint manipulation, Stair training, Orthotic/Fit training, Aquatic Therapy, Dry Needling, Electrical stimulation, Spinal manipulation, Spinal mobilization, Cryotherapy, Moist heat, Taping, Traction, Biofeedback, Ionotophoresis 4mg /ml Dexamethasone, Manual therapy, and Re-evaluation   PLAN FOR NEXT  SESSION: what did Dr. Steward Drone say from [facility] MD appt? Continue painfree bil LE strengthening            Patient Details  Name: Tracy Mcpherson MRN: [id number] Date of Birth: Jan 28, 1971 Referring Provider:  Mila Palmer, MD  Encounter Date: 07/20/2023   Luna Fuse, PT 07/20/2023, 8:20 AM  [facility] Out[facility] at [facility] [address] [place], [place], [address] Phone: [phone]   Fax:  [phone]

## 2023-07-22 DIAGNOSIS — G4733 Obstructive sleep apnea (adult) (pediatric): Secondary | ICD-10-CM | POA: Diagnosis not present

## 2023-07-22 DIAGNOSIS — G40219 Localization-related (focal) (partial) symptomatic epilepsy and epileptic syndromes with complex partial seizures, intractable, without status epilepticus: Secondary | ICD-10-CM | POA: Diagnosis not present

## 2023-07-26 ENCOUNTER — Ambulatory Visit (HOSPITAL_BASED_OUTPATIENT_CLINIC_OR_DEPARTMENT_OTHER): Payer: BC Managed Care – PPO | Admitting: Orthopaedic Surgery

## 2023-07-26 ENCOUNTER — Telehealth: Payer: Self-pay | Admitting: Orthopaedic Surgery

## 2023-07-26 NOTE — Telephone Encounter (Signed)
Pt called in to R/S 3rd gel injection Dr does not have anything avail near pt would like to be worked in next week please advise

## 2023-07-30 ENCOUNTER — Ambulatory Visit (INDEPENDENT_AMBULATORY_CARE_PROVIDER_SITE_OTHER): Payer: BC Managed Care – PPO | Admitting: Student

## 2023-07-30 ENCOUNTER — Other Ambulatory Visit (HOSPITAL_BASED_OUTPATIENT_CLINIC_OR_DEPARTMENT_OTHER): Payer: Self-pay | Admitting: Student

## 2023-07-30 ENCOUNTER — Encounter (HOSPITAL_BASED_OUTPATIENT_CLINIC_OR_DEPARTMENT_OTHER): Payer: Self-pay | Admitting: Student

## 2023-07-30 DIAGNOSIS — M25561 Pain in right knee: Secondary | ICD-10-CM | POA: Diagnosis not present

## 2023-07-30 DIAGNOSIS — G8929 Other chronic pain: Secondary | ICD-10-CM | POA: Diagnosis not present

## 2023-07-30 DIAGNOSIS — M222X2 Patellofemoral disorders, left knee: Secondary | ICD-10-CM

## 2023-07-30 DIAGNOSIS — M25562 Pain in left knee: Secondary | ICD-10-CM | POA: Diagnosis not present

## 2023-07-30 DIAGNOSIS — M222X1 Patellofemoral disorders, right knee: Secondary | ICD-10-CM

## 2023-07-30 DIAGNOSIS — M5410 Radiculopathy, site unspecified: Secondary | ICD-10-CM | POA: Diagnosis not present

## 2023-07-30 MED ORDER — DICLOFENAC SODIUM 75 MG PO TBEC
75.0000 mg | DELAYED_RELEASE_TABLET | Freq: Two times a day (BID) | ORAL | 3 refills | Status: DC | PRN
Start: 2023-07-30 — End: 2024-06-22

## 2023-07-30 MED ORDER — SODIUM HYALURONATE (VISCOSUP) 20 MG/2ML IX SOSY
20.0000 mg | PREFILLED_SYRINGE | INTRA_ARTICULAR | Status: AC | PRN
Start: 2023-07-30 — End: 2023-07-30
  Administered 2023-07-30: 20 mg via INTRA_ARTICULAR

## 2023-07-30 NOTE — Progress Notes (Signed)
Chief Complaint: Bilateral knee pain     History of Present Illness:   07/30/2023: Tracy Mcpherson is here today for the third round of bilateral Euflexxa injections.  She does report an increase in aching in both knees over the past week.  She is having a more difficult time with walking.  Denies any other changes in symptoms.   Tracy Mcpherson is a 52 y.o. female presents today with bilateral knee pain right worse than left.  This is been really over the last several years although it has been getting quite painful and tight since October.  There is pain predominantly medially.  The right is worse than the left.  She has difficulty time walking and going to [place].  She notes that this has become increasingly painful since she began a sedentary job.  She is experiencing popping.  She does feel like the knee is giving out as well.    Surgical History:   None  PMH/PSH/Family History/Social History/Meds/Allergies:    Past Medical History:  Diagnosis Date   Anemia    Anxiety    Asthma    Constipation    Epilepsy (HCC)    Fibroids    History of gestational diabetes    History of herpes simplex type 2 infection    History of pregnancy induced hypertension    Hx of leukocytosis    mild   Iron deficiency anemia    Joint pain    Lower extremity edema    Migraines    Morbid obesity (HCC)    Pain in both lower legs    Plantar fasciitis    Prediabetes    Right knee pain    Seasonal allergies    Seizures (HCC)    Sleep apnea    Vitamin B 12 deficiency    Vitamin D deficiency    Past Surgical History:  Procedure Laterality Date   CESAREAN SECTION  11-02-2007   CRANIOTOMY FOR TEMPORAL LOBECTOMY     GYNECOLOGIC CRYOSURGERY     IUD REMOVAL  12-16-2009   REMOVAL THYROID CYST  2000   Social History   Socioeconomic History   Marital status: Legally Separated    Spouse name: Not on file   Number of children: 1   Years of education: Not on file    Highest education level: Not on file  Occupational History   Occupation: Consulting civil engineer  Tobacco Use   Smoking status: Never   Smokeless tobacco: Never  Vaping Use   Vaping status: Never Used  Substance and Sexual Activity   Alcohol use: No   Drug use: No   Sexual activity: Not Currently    Birth control/protection: None  Other Topics Concern   Not on file  Social History Narrative   Not on file   Social Determinants of Health   Financial Resource Strain: Not on file  Food Insecurity: No Food Insecurity (04/20/2022)   Hunger Vital Sign    Worried About Running Out of Food in the Last Year: Never true    Ran Out of Food in the Last Year: Never true  Transportation Needs: No Transportation Needs (04/20/2022)   PRAPARE - Administrator, Civil Service (Medical): No    Lack of Transportation (Non-Medical): No  Physical Activity: Not on file  Stress: Not  on file  Social Connections: Unknown (03/04/2023)   Received from [facility]   Social Network    Social Network: Not on file   Family History  Problem Relation Age of Onset   Hypertension Mother    Obesity Mother    Fibroids Mother    Hypertension Sister    Cancer Maternal Uncle        lung   Cancer Maternal Grandmother        lung   Kidney disease Maternal Grandfather    Alzheimer's disease Paternal Grandmother    Allergies  Allergen Reactions   Flexeril [Cyclobenzaprine Hcl] Hives   Lamictal [Lamotrigine] Other (See Comments)    Muscles  Tightened.   Morphine And Codeine Hives   Percocet [Oxycodone-Acetaminophen] Hives   Doxycycline Hives   Topiramate Other (See Comments)    Affecting her thinking   Vimpat [Lacosamide]     Muscle tightness    Current Outpatient Medications  Medication Sig Dispense Refill   albuterol (VENTOLIN HFA) 108 (90 Base) MCG/ACT inhaler Inhale into the lungs.     ASHWAGANDHA PO Take by mouth daily. (Patient not taking: Reported on 03/30/2023)     Azelastine HCl 137  MCG/SPRAY SOLN Place 1 spray into both nostrils 2 (two) times daily.     diclofenac (VOLTAREN) 75 MG EC tablet Take 1 tablet (75 mg total) by mouth 2 (two) times daily as needed. 60 tablet 3   diclofenac (VOLTAREN) 75 MG EC tablet Take 1 tablet (75 mg total) by mouth 2 (two) times daily. 30 tablet 3   diclofenac (VOLTAREN) 75 MG EC tablet TAKE 1 TABLET BY MOUTH TWICE A DAY 30 tablet 3   famotidine (PEPCID) 20 MG tablet Take 1 tablet (20 mg total) by mouth 2 (two) times daily. 60 tablet 0   fluocinonide ointment (LIDEX) 0.05 % Apply topically.     Iron-FA-B Cmp-C-Biot-Probiotic (FUSION PLUS) CAPS Take 1 capsule by mouth daily.     ketotifen (ZADITOR) 0.025 % ophthalmic solution 1 drop as needed. (Patient not taking: Reported on 03/30/2023)     Multiple Vitamin (MULTIVITAMIN WITH MINERALS) TABS tablet Take 1 tablet by mouth daily. (Patient not taking: Reported on 03/30/2023)     pantoprazole (PROTONIX) 40 MG tablet Take 1 tablet (40 mg total) by mouth daily. 30 tablet 0   triamcinolone ointment (KENALOG) 0.1 % SMARTSIG:Topical 4 Times a Week (Patient not taking: Reported on 03/30/2023)     Vitamin D, Ergocalciferol, (DRISDOL) 1.25 MG (50000 UNIT) CAPS capsule Take 1 capsule (50,000 Units total) by mouth every 3 (three) days. (Patient taking differently: Take 50,000 Units by mouth once a week.) 10 capsule 0   No current facility-administered medications for this visit.   No results found.  Review of Systems:   A ROS was performed including pertinent positives and negatives as documented in the HPI.  Physical Exam :   Constitutional: NAD and appears stated age Neurological: Alert and oriented Psych: Appropriate affect and cooperative Last menstrual period 10/13/2019.   Comprehensive Musculoskeletal Exam:    Active range of motion of bilateral knees from 0 to 120 degrees with positive crepitus.  No evidence of erythema or warmth.  Tenderness over bilateral medial joint lines.  Imaging:   Xray  review from 11/09/2022 (left knee 4 views, right knee 4 views): Bilateral tricompartmental osteoarthritis that is worse in the medial compartments.  Diffuse osteophytes.   I personally reviewed and interpreted the radiographs.   Assessment:   52 y.o. female with bilateral  knee osteoarthritis here today for third round of Euflexxa injections.  She does appear to be having an inflammatory reaction as a result of the injections due to increased pain and aching.  Discussed that this should continue to improve over the coming weeks.  Injections were performed today with assistance of ultrasound after patient consent.  Patient tolerated this well.  Will plan to assess relief with injections and return to clinic as needed.  Plan :    -Bilateral knee Euflexxa injections performed after verbal consent obtained     Procedure Note  Patient: DERION MALACH             Date of Birth: Jun 08, 1971           MRN: [id number]             Visit Date: 07/30/2023  Procedures: Visit Diagnoses:  1. Chronic pain of right knee   2. Chronic pain of left knee     Large Joint Inj: bilateral knee on 07/30/2023 6:03 PM Indications: pain Details: 22 G 1.5 in needle, anterolateral approach Medications (Right): 20 mg Sodium Hyaluronate (Viscosup) 20 MG/2ML Medications (Left): 20 mg Sodium Hyaluronate (Viscosup) 20 MG/2ML Outcome: tolerated well, no immediate complications Procedure, treatment alternatives, risks and benefits explained, specific risks discussed. Consent was given by the patient. Immediately prior to procedure a time out was called to verify the correct patient, procedure, equipment, support staff and site/side marked as required. Patient was prepped and draped in the usual sterile fashion.       I personally saw and evaluated the patient, and participated in the management and treatment plan.   Hazle Nordmann, PA-C Orthopedics

## 2023-08-02 ENCOUNTER — Other Ambulatory Visit: Payer: Self-pay | Admitting: Family Medicine

## 2023-08-02 DIAGNOSIS — M5410 Radiculopathy, site unspecified: Secondary | ICD-10-CM

## 2023-08-07 DIAGNOSIS — M9903 Segmental and somatic dysfunction of lumbar region: Secondary | ICD-10-CM | POA: Diagnosis not present

## 2023-08-07 DIAGNOSIS — M9905 Segmental and somatic dysfunction of pelvic region: Secondary | ICD-10-CM | POA: Diagnosis not present

## 2023-08-07 DIAGNOSIS — G4710 Hypersomnia, unspecified: Secondary | ICD-10-CM | POA: Diagnosis not present

## 2023-08-07 DIAGNOSIS — M5442 Lumbago with sciatica, left side: Secondary | ICD-10-CM | POA: Diagnosis not present

## 2023-08-07 DIAGNOSIS — G4733 Obstructive sleep apnea (adult) (pediatric): Secondary | ICD-10-CM | POA: Diagnosis not present

## 2023-08-07 DIAGNOSIS — M9901 Segmental and somatic dysfunction of cervical region: Secondary | ICD-10-CM | POA: Diagnosis not present

## 2023-08-07 DIAGNOSIS — M9902 Segmental and somatic dysfunction of thoracic region: Secondary | ICD-10-CM | POA: Diagnosis not present

## 2023-08-09 DIAGNOSIS — M9903 Segmental and somatic dysfunction of lumbar region: Secondary | ICD-10-CM | POA: Diagnosis not present

## 2023-08-09 DIAGNOSIS — M9902 Segmental and somatic dysfunction of thoracic region: Secondary | ICD-10-CM | POA: Diagnosis not present

## 2023-08-09 DIAGNOSIS — M9905 Segmental and somatic dysfunction of pelvic region: Secondary | ICD-10-CM | POA: Diagnosis not present

## 2023-08-09 DIAGNOSIS — M5442 Lumbago with sciatica, left side: Secondary | ICD-10-CM | POA: Diagnosis not present

## 2023-08-14 DIAGNOSIS — M9905 Segmental and somatic dysfunction of pelvic region: Secondary | ICD-10-CM | POA: Diagnosis not present

## 2023-08-14 DIAGNOSIS — M9902 Segmental and somatic dysfunction of thoracic region: Secondary | ICD-10-CM | POA: Diagnosis not present

## 2023-08-14 DIAGNOSIS — M5442 Lumbago with sciatica, left side: Secondary | ICD-10-CM | POA: Diagnosis not present

## 2023-08-14 DIAGNOSIS — M9903 Segmental and somatic dysfunction of lumbar region: Secondary | ICD-10-CM | POA: Diagnosis not present

## 2023-08-16 DIAGNOSIS — M5442 Lumbago with sciatica, left side: Secondary | ICD-10-CM | POA: Diagnosis not present

## 2023-08-16 DIAGNOSIS — M9901 Segmental and somatic dysfunction of cervical region: Secondary | ICD-10-CM | POA: Diagnosis not present

## 2023-08-16 DIAGNOSIS — M9905 Segmental and somatic dysfunction of pelvic region: Secondary | ICD-10-CM | POA: Diagnosis not present

## 2023-08-16 DIAGNOSIS — M9903 Segmental and somatic dysfunction of lumbar region: Secondary | ICD-10-CM | POA: Diagnosis not present

## 2023-08-16 DIAGNOSIS — M9906 Segmental and somatic dysfunction of lower extremity: Secondary | ICD-10-CM | POA: Diagnosis not present

## 2023-08-16 DIAGNOSIS — M9902 Segmental and somatic dysfunction of thoracic region: Secondary | ICD-10-CM | POA: Diagnosis not present

## 2023-08-23 ENCOUNTER — Encounter: Payer: Self-pay | Admitting: Physical Medicine and Rehabilitation

## 2023-09-06 DIAGNOSIS — M9906 Segmental and somatic dysfunction of lower extremity: Secondary | ICD-10-CM | POA: Diagnosis not present

## 2023-09-06 DIAGNOSIS — M9905 Segmental and somatic dysfunction of pelvic region: Secondary | ICD-10-CM | POA: Diagnosis not present

## 2023-09-06 DIAGNOSIS — M9901 Segmental and somatic dysfunction of cervical region: Secondary | ICD-10-CM | POA: Diagnosis not present

## 2023-09-06 DIAGNOSIS — M9902 Segmental and somatic dysfunction of thoracic region: Secondary | ICD-10-CM | POA: Diagnosis not present

## 2023-09-06 DIAGNOSIS — M9903 Segmental and somatic dysfunction of lumbar region: Secondary | ICD-10-CM | POA: Diagnosis not present

## 2023-09-06 DIAGNOSIS — M5442 Lumbago with sciatica, left side: Secondary | ICD-10-CM | POA: Diagnosis not present

## 2023-09-07 DIAGNOSIS — G4733 Obstructive sleep apnea (adult) (pediatric): Secondary | ICD-10-CM | POA: Diagnosis not present

## 2023-09-07 DIAGNOSIS — G4710 Hypersomnia, unspecified: Secondary | ICD-10-CM | POA: Diagnosis not present

## 2023-09-13 DIAGNOSIS — M9902 Segmental and somatic dysfunction of thoracic region: Secondary | ICD-10-CM | POA: Diagnosis not present

## 2023-09-13 DIAGNOSIS — M9906 Segmental and somatic dysfunction of lower extremity: Secondary | ICD-10-CM | POA: Diagnosis not present

## 2023-09-13 DIAGNOSIS — M5442 Lumbago with sciatica, left side: Secondary | ICD-10-CM | POA: Diagnosis not present

## 2023-09-13 DIAGNOSIS — M9905 Segmental and somatic dysfunction of pelvic region: Secondary | ICD-10-CM | POA: Diagnosis not present

## 2023-09-13 DIAGNOSIS — M9903 Segmental and somatic dysfunction of lumbar region: Secondary | ICD-10-CM | POA: Diagnosis not present

## 2023-09-20 DIAGNOSIS — M9906 Segmental and somatic dysfunction of lower extremity: Secondary | ICD-10-CM | POA: Diagnosis not present

## 2023-09-20 DIAGNOSIS — M9902 Segmental and somatic dysfunction of thoracic region: Secondary | ICD-10-CM | POA: Diagnosis not present

## 2023-09-20 DIAGNOSIS — M9905 Segmental and somatic dysfunction of pelvic region: Secondary | ICD-10-CM | POA: Diagnosis not present

## 2023-09-20 DIAGNOSIS — M9903 Segmental and somatic dysfunction of lumbar region: Secondary | ICD-10-CM | POA: Diagnosis not present

## 2023-09-20 DIAGNOSIS — M5442 Lumbago with sciatica, left side: Secondary | ICD-10-CM | POA: Diagnosis not present

## 2023-10-07 DIAGNOSIS — G4710 Hypersomnia, unspecified: Secondary | ICD-10-CM | POA: Diagnosis not present

## 2023-10-07 DIAGNOSIS — G4733 Obstructive sleep apnea (adult) (pediatric): Secondary | ICD-10-CM | POA: Diagnosis not present

## 2023-10-28 ENCOUNTER — Encounter
Payer: BC Managed Care – PPO | Attending: Physical Medicine and Rehabilitation | Admitting: Physical Medicine and Rehabilitation

## 2023-11-08 DIAGNOSIS — Z6841 Body Mass Index (BMI) 40.0 and over, adult: Secondary | ICD-10-CM | POA: Diagnosis not present

## 2023-11-08 DIAGNOSIS — Z01818 Encounter for other preprocedural examination: Secondary | ICD-10-CM | POA: Diagnosis not present

## 2023-11-21 DIAGNOSIS — F418 Other specified anxiety disorders: Secondary | ICD-10-CM | POA: Diagnosis not present

## 2023-11-21 DIAGNOSIS — R030 Elevated blood-pressure reading, without diagnosis of hypertension: Secondary | ICD-10-CM | POA: Diagnosis not present

## 2023-12-11 ENCOUNTER — Encounter: Payer: Self-pay | Admitting: Hematology and Oncology

## 2023-12-18 ENCOUNTER — Ambulatory Visit (HOSPITAL_BASED_OUTPATIENT_CLINIC_OR_DEPARTMENT_OTHER): Payer: Medicaid Other | Admitting: Orthopaedic Surgery

## 2023-12-19 ENCOUNTER — Encounter: Payer: Self-pay | Admitting: Hematology and Oncology

## 2023-12-20 ENCOUNTER — Ambulatory Visit (INDEPENDENT_AMBULATORY_CARE_PROVIDER_SITE_OTHER): Payer: 59 | Admitting: Orthopaedic Surgery

## 2023-12-20 DIAGNOSIS — M222X2 Patellofemoral disorders, left knee: Secondary | ICD-10-CM

## 2023-12-20 DIAGNOSIS — M25561 Pain in right knee: Secondary | ICD-10-CM | POA: Diagnosis not present

## 2023-12-20 DIAGNOSIS — M222X1 Patellofemoral disorders, right knee: Secondary | ICD-10-CM

## 2023-12-20 DIAGNOSIS — M25562 Pain in left knee: Secondary | ICD-10-CM | POA: Diagnosis not present

## 2023-12-20 MED ORDER — LIDOCAINE HCL 1 % IJ SOLN
4.0000 mL | INTRAMUSCULAR | Status: AC | PRN
Start: 2023-12-20 — End: 2023-12-20
  Administered 2023-12-20: 4 mL

## 2023-12-20 MED ORDER — TRIAMCINOLONE ACETONIDE 40 MG/ML IJ SUSP
80.0000 mg | INTRAMUSCULAR | Status: AC | PRN
  Administered 2023-12-20: 80 mg via INTRA_ARTICULAR

## 2023-12-20 MED ORDER — LIDOCAINE HCL 1 % IJ SOLN
4.0000 mL | INTRAMUSCULAR | Status: AC | PRN
  Administered 2023-12-20: 4 mL

## 2023-12-20 NOTE — Progress Notes (Signed)
Chief Complaint: Knee pain     History of Present Illness:   12/20/2023: Presents today for follow-up and requesting bilateral steroid injections.  She does have an upcoming cruise to the [place].  Unfortunately she did not get significant relief from her Euflexxa injections.  She is pending bariatric surgery  Tracy Mcpherson is a 53 y.o. female presents today with bilateral knee pain right worse than left.  This is been really over the last several years although it has been getting quite painful and tight since October.  There is pain predominantly medially.  The right is worse than the left.  She has difficulty time walking and going to [place].  She notes that this has become increasingly painful since she began a sedentary job.  She is experiencing popping.  She does feel like the knee is giving out as well.    Surgical History:   None  PMH/PSH/Family History/Social History/Meds/Allergies:    Past Medical History:  Diagnosis Date  . Anemia   . Anxiety   . Asthma   . Constipation   . Epilepsy (HCC)   . Fibroids   . History of gestational diabetes   . History of herpes simplex type 2 infection   . History of pregnancy induced hypertension   . Hx of leukocytosis    mild  . Iron deficiency anemia   . Joint pain   . Lower extremity edema   . Migraines   . Morbid obesity (HCC)   . Pain in both lower legs   . Plantar fasciitis   . Prediabetes   . Right knee pain   . Seasonal allergies   . Seizures (HCC)   . Sleep apnea   . Vitamin B 12 deficiency   . Vitamin D deficiency    Past Surgical History:  Procedure Laterality Date  . CESAREAN SECTION  11-02-2007  . CRANIOTOMY FOR TEMPORAL LOBECTOMY    . GYNECOLOGIC CRYOSURGERY    . IUD REMOVAL  12-16-2009  . REMOVAL THYROID CYST  2000   Social History   Socioeconomic History  . Marital status: Legally Separated    Spouse name: Not on file  . Number of children: 1  . Years of  education: Not on file  . Highest education level: Not on file  Occupational History  . Occupation: Consulting civil engineer  Tobacco Use  . Smoking status: Never  . Smokeless tobacco: Never  Vaping Use  . Vaping status: Never Used  Substance and Sexual Activity  . Alcohol use: No  . Drug use: No  . Sexual activity: Not Currently    Birth control/protection: None  Other Topics Concern  . Not on file  Social History Narrative  . Not on file   Social Drivers of Health   Financial Resource Strain: Not on file  Food Insecurity: No Food Insecurity (04/20/2022)   Hunger Vital Sign   . Worried About Programme researcher, broadcasting/film/video in the Last Year: Never true   . Ran Out of Food in the Last Year: Never true  Transportation Needs: No Transportation Needs (04/20/2022)   PRAPARE - Transportation   . Lack of Transportation (Medical): No   . Lack of Transportation (Non-Medical): No  Physical Activity: Not on file  Stress: Not on file  Social Connections: Unknown (03/04/2023)  [facility]   Social Network   . Social Network: Not on file   Family History  Problem Relation Age of Onset  . Hypertension Mother   . Obesity Mother   . Fibroids Mother   . Hypertension Sister   . Cancer Maternal Uncle        lung  . Cancer Maternal Grandmother        lung  . Kidney disease Maternal Grandfather   . Alzheimer's disease Paternal Grandmother    Allergies  Allergen Reactions  . Flexeril [Cyclobenzaprine Hcl] Hives  . Lamictal [Lamotrigine] Other (See Comments)    Muscles  Tightened.  . Morphine And Codeine Hives  . Percocet [Oxycodone-Acetaminophen] Hives  . Doxycycline Hives  . Topiramate Other (See Comments)    Affecting her thinking  . Vimpat [Lacosamide]     Muscle tightness    Current Outpatient Medications  Medication Sig Dispense Refill  . albuterol (VENTOLIN HFA) 108 (90 Base) MCG/ACT inhaler Inhale into the lungs.    . ASHWAGANDHA PO Take by mouth daily.  (Patient not taking: Reported on 03/30/2023)    . Azelastine HCl 137 MCG/SPRAY SOLN Place 1 spray into both nostrils 2 (two) times daily.    . diclofenac (VOLTAREN) 75 MG EC tablet Take 1 tablet (75 mg total) by mouth 2 (two) times daily as needed. 60 tablet 3  . famotidine (PEPCID) 20 MG tablet Take 1 tablet (20 mg total) by mouth 2 (two) times daily. 60 tablet 0  . fluocinonide ointment (LIDEX) 0.05 % Apply topically.    . Iron-FA-B Cmp-C-Biot-Probiotic (FUSION PLUS) CAPS Take 1 capsule by mouth daily.    Marland Kitchen ketotifen (ZADITOR) 0.025 % ophthalmic solution 1 drop as needed. (Patient not taking: Reported on 03/30/2023)    . Multiple Vitamin (MULTIVITAMIN WITH MINERALS) TABS tablet Take 1 tablet by mouth daily. (Patient not taking: Reported on 03/30/2023)    . pantoprazole (PROTONIX) 40 MG tablet Take 1 tablet (40 mg total) by mouth daily. 30 tablet 0  . triamcinolone ointment (KENALOG) 0.1 % SMARTSIG:Topical 4 Times a Week (Patient not taking: Reported on 03/30/2023)    . Vitamin D, Ergocalciferol, (DRISDOL) 1.25 MG (50000 UNIT) CAPS capsule Take 1 capsule (50,000 Units total) by mouth every 3 (three) days. (Patient taking differently: Take 50,000 Units by mouth once a week.) 10 capsule 0   No current facility-administered medications for this visit.   No results found.  Review of Systems:   A ROS was performed including pertinent positives and negatives as documented in the HPI.  Physical Exam :   Constitutional: NAD and appears stated age Neurological: Alert and oriented Psych: Appropriate affect and cooperative Last menstrual period 10/13/2019.   Comprehensive Musculoskeletal Exam:    Tenderness palpation about the medial lateral joint lines on the right and left.  There is significant crepitus.  Range of motion is from 0 to 120 degrees on both sides.  Imaging:   Xray (4 views right knee, 4 views left knee): There is significant medial joint space narrowing of both although left worse  than right with regard to the tibiofemoral joint spaces    I personally reviewed and interpreted the radiographs.   Assessment:   53 y.o. 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  Presents today for bilateral steroid injections. Plan :    -Bilateral knee injections performed after verbal consent obtained     Procedure Note  Patient: Maureena Dabbs [facility]  Date of Birth: 03/31/1971           MRN: [id number]             Visit Date: 12/20/2023  Procedures: Visit Diagnoses: No diagnosis found.  Large Joint Inj: R knee on 12/20/2023 12:10 PM Indications: pain Details: 22 G 1.5 in needle, ultrasound-guided anterior approach  Arthrogram: No  Medications: 4 mL lidocaine 1 %; 80 mg triamcinolone acetonide 40 MG/ML Outcome: tolerated well, no immediate complications Procedure, treatment alternatives, risks and benefits explained, specific risks discussed. Consent was given by the patient. Immediately prior to procedure a time out was called to verify the correct patient, procedure, equipment, support staff and site/side marked as required. Patient was prepped and draped in the usual sterile fashion.    Large Joint Inj: L knee on 12/20/2023 12:11 PM Indications: pain Details: 22 G 1.5 in needle, ultrasound-guided anterior approach  Arthrogram: No  Medications: 4 mL lidocaine 1 %; 80 mg triamcinolone acetonide 40 MG/ML Outcome: tolerated well, no immediate complications Procedure, treatment alternatives, risks and benefits explained, specific risks discussed. Consent was given by the patient. Immediately prior to procedure a time out was called to verify the correct patient, procedure, equipment, support staff and site/side marked as required. Patient was prepped and draped in the usual sterile fashion.      I personally saw and evaluated the patient, and participated in the management and treatment plan.  Huel Cote, MD Attending  Physician, Orthopedic Surgery  This document was dictated using Dragon voice recognition software. A reasonable attempt at proof reading has been made to minimize errors.

## 2024-03-11 IMAGING — US US ASPIRATION RIGHT BREAST
1 series · 4 of 4 positions shown · non-contrast
Comparison: None Available.

CLINICAL DATA: 51-year-old female presenting for ultrasound-guided
cyst aspiration of the right breast.

EXAM:
ULTRASOUND GUIDED RIGHT BREAST CYST ASPIRATION

[Series 1: us aspiration right breast · 0.06mm/px · 4 of 4 slices shown]
[im 1/4]
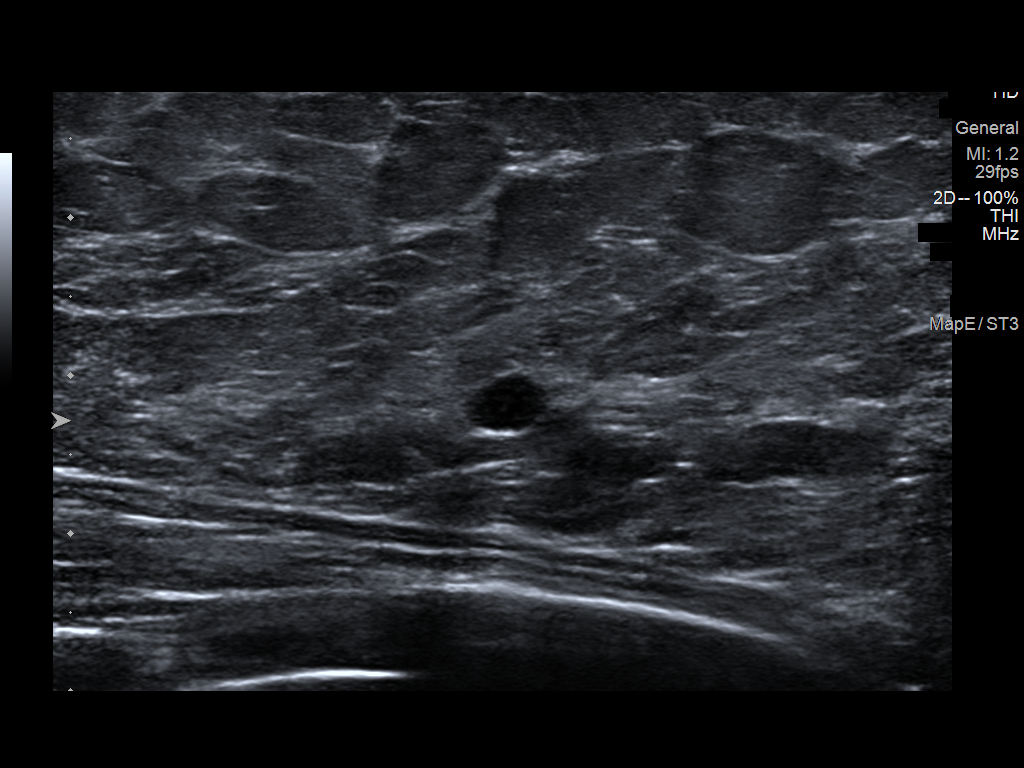
[im 2/4]
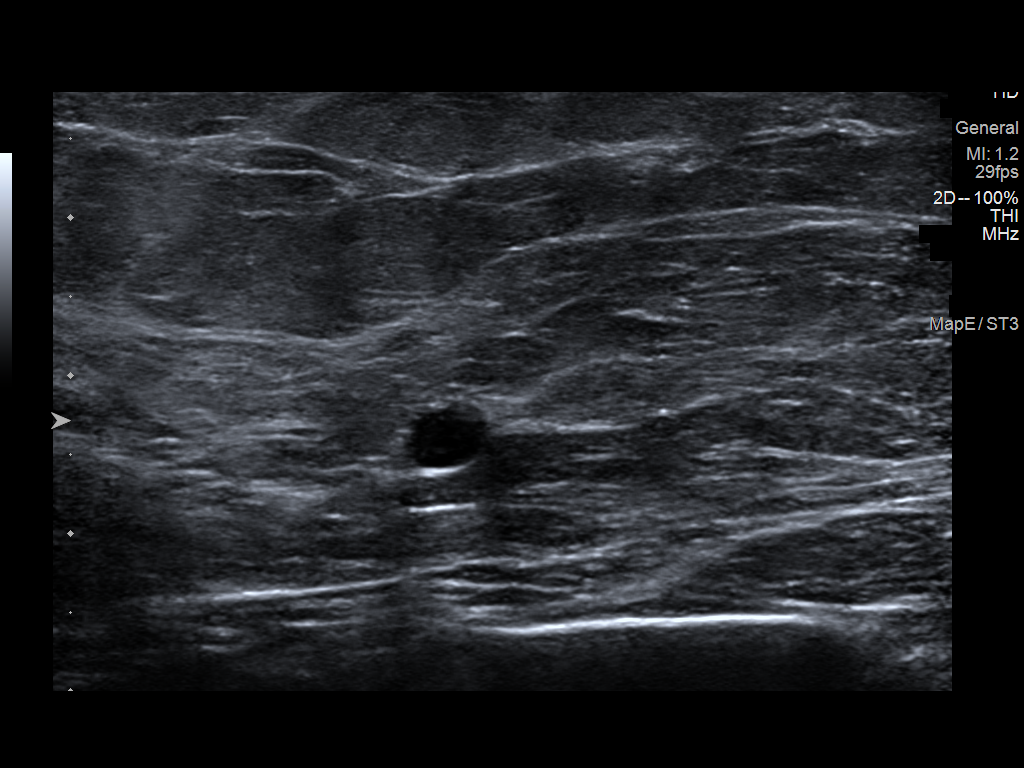
[im 3/4]
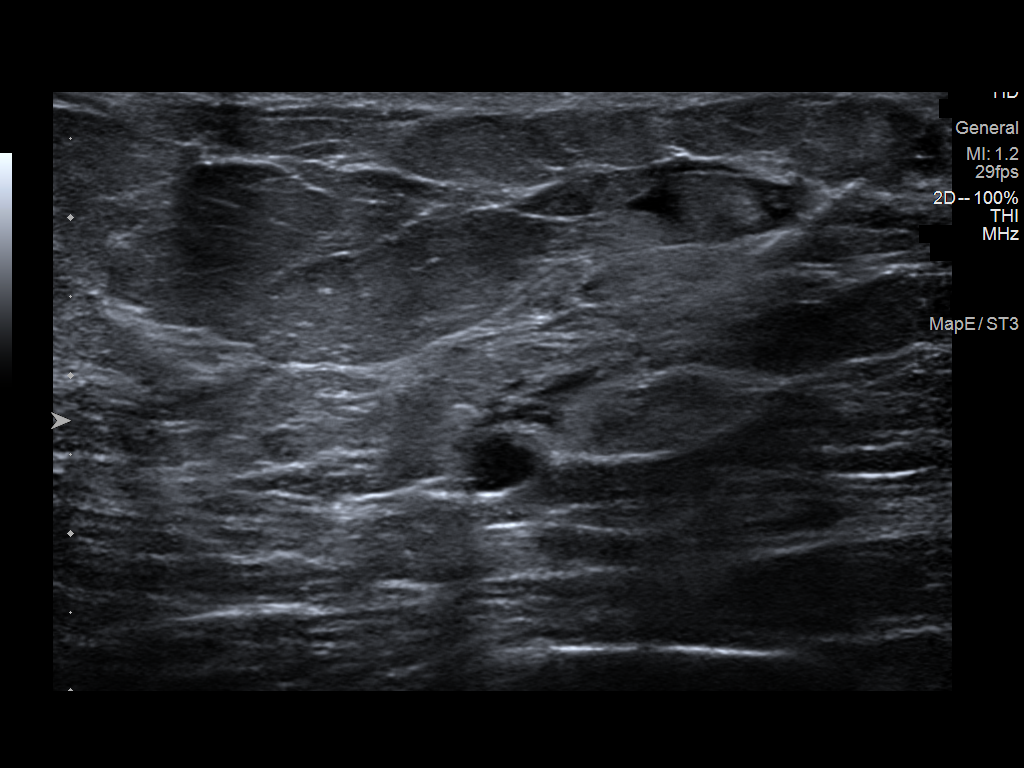
[im 4/4]
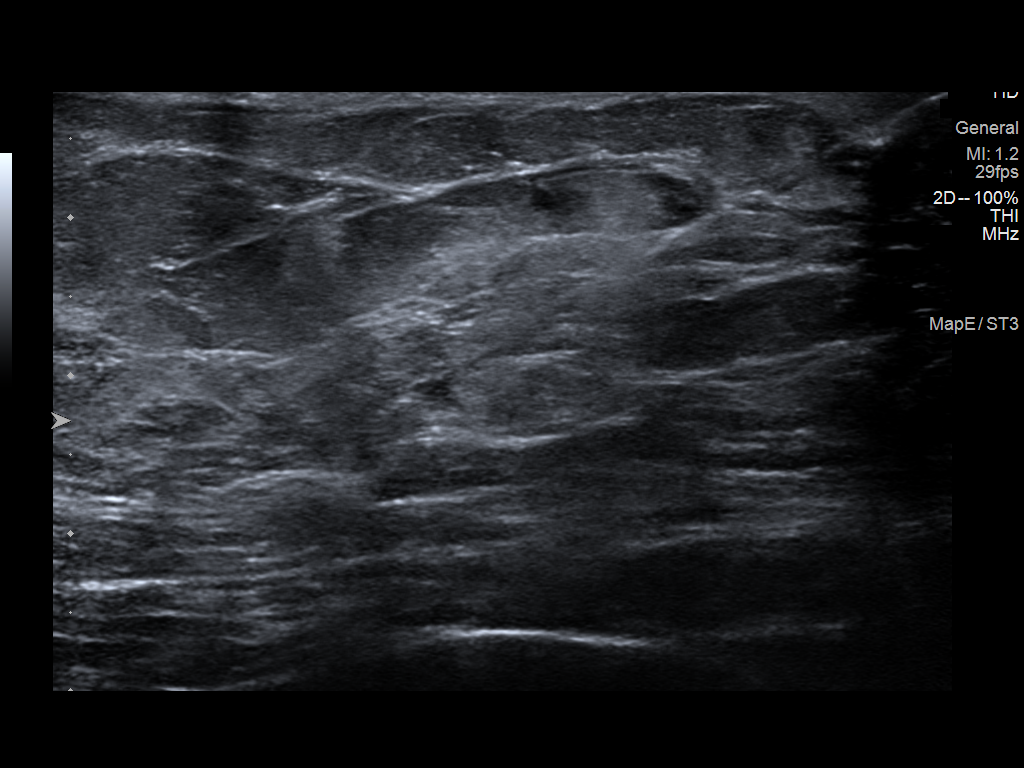

[4 of 4 positions shown; findings below may reference images not displayed]

PROCEDURE:
The patient and I discussed the procedure of ultrasound-guided
aspiration including benefits and alternatives. We discussed the
high likelihood of a successful procedure. We discussed the risks of
the procedure including infection, bleeding, tissue injury, and
inadequate sampling. Informed written consent was given. The usual
time out protocol was performed immediately prior to the procedure.

Using sterile technique, 1% lidocaine, under direct ultrasound
visualization, needle aspiration of a small cyst in the right breast
at [DATE], 8 cm from the nipple was performed. The cyst aspirated to
complete resolution.
IMPRESSION: Ultrasound-guided aspiration of a right breast cyst at [DATE]. No
apparent complications.

RECOMMENDATIONS:
Screening mammogram in one year.(Code:VF-5-27C)

## 2024-03-27 ENCOUNTER — Encounter: Payer: Self-pay | Admitting: Hematology and Oncology

## 2024-04-01 ENCOUNTER — Encounter (HOSPITAL_BASED_OUTPATIENT_CLINIC_OR_DEPARTMENT_OTHER): Payer: Self-pay | Admitting: Physical Therapy

## 2024-04-01 NOTE — Therapy (Signed)
 [facility] Outpatient Rehabilitation at [facility] [address] [place], [place], [address] Phone: [phone]   Fax:  [phone]  Patient Details  Name: Tracy Mcpherson MRN: [id number] Date of Birth: 21-Mar-1971 Referring Provider:  No ref. provider found  Encounter Date: 04/01/2024  PHYSICAL THERAPY DISCHARGE SUMMARY  Visits from Start of Care: 7  Current functional level related to goals / functional outcomes: Unknown as patient has not returned with last treatment on 06/15/23.   Remaining deficits: unknown   Education / Equipment: HEP   Patient agrees to discharge. Patient goals were partially met. Patient is being discharged due to not returning since the last visit.  2:53 PM, 04/01/24 Beather Liming PT, DPT Physical Therapist at [facility] Outpatient Rehabilitation at [facility] [address] [place], [place], [address] Phone: [phone]   Fax:  [phone]

## 2024-04-15 ENCOUNTER — Encounter (HOSPITAL_BASED_OUTPATIENT_CLINIC_OR_DEPARTMENT_OTHER): Payer: Self-pay

## 2024-04-15 ENCOUNTER — Ambulatory Visit (HOSPITAL_BASED_OUTPATIENT_CLINIC_OR_DEPARTMENT_OTHER): Admitting: Orthopaedic Surgery

## 2024-04-29 ENCOUNTER — Other Ambulatory Visit: Payer: Self-pay | Admitting: Family Medicine

## 2024-04-29 DIAGNOSIS — Z1231 Encounter for screening mammogram for malignant neoplasm of breast: Secondary | ICD-10-CM

## 2024-05-01 ENCOUNTER — Ambulatory Visit (HOSPITAL_BASED_OUTPATIENT_CLINIC_OR_DEPARTMENT_OTHER): Admitting: Orthopaedic Surgery

## 2024-05-11 ENCOUNTER — Ambulatory Visit

## 2024-05-12 ENCOUNTER — Ambulatory Visit

## 2024-05-15 ENCOUNTER — Encounter: Payer: Self-pay | Admitting: Hematology and Oncology

## 2024-05-20 ENCOUNTER — Ambulatory Visit (HOSPITAL_BASED_OUTPATIENT_CLINIC_OR_DEPARTMENT_OTHER): Admitting: Orthopaedic Surgery

## 2024-05-22 ENCOUNTER — Encounter: Payer: Self-pay | Admitting: Hematology and Oncology

## 2024-05-27 ENCOUNTER — Ambulatory Visit (HOSPITAL_BASED_OUTPATIENT_CLINIC_OR_DEPARTMENT_OTHER): Admitting: Orthopaedic Surgery

## 2024-05-27 ENCOUNTER — Encounter (HOSPITAL_BASED_OUTPATIENT_CLINIC_OR_DEPARTMENT_OTHER): Payer: Self-pay

## 2024-06-04 ENCOUNTER — Ambulatory Visit (HOSPITAL_BASED_OUTPATIENT_CLINIC_OR_DEPARTMENT_OTHER): Admitting: Orthopaedic Surgery

## 2024-06-12 ENCOUNTER — Other Ambulatory Visit (HOSPITAL_BASED_OUTPATIENT_CLINIC_OR_DEPARTMENT_OTHER): Payer: Self-pay | Admitting: Family Medicine

## 2024-06-12 DIAGNOSIS — Z1231 Encounter for screening mammogram for malignant neoplasm of breast: Secondary | ICD-10-CM

## 2024-06-15 ENCOUNTER — Ambulatory Visit (HOSPITAL_BASED_OUTPATIENT_CLINIC_OR_DEPARTMENT_OTHER): Admission: RE | Admit: 2024-06-15 | Source: Ambulatory Visit | Admitting: Radiology

## 2024-06-15 DIAGNOSIS — Z1231 Encounter for screening mammogram for malignant neoplasm of breast: Secondary | ICD-10-CM

## 2024-06-16 ENCOUNTER — Encounter (HOSPITAL_BASED_OUTPATIENT_CLINIC_OR_DEPARTMENT_OTHER): Admitting: Radiology

## 2024-06-17 ENCOUNTER — Ambulatory Visit (HOSPITAL_BASED_OUTPATIENT_CLINIC_OR_DEPARTMENT_OTHER): Admitting: Orthopaedic Surgery

## 2024-06-19 ENCOUNTER — Ambulatory Visit (HOSPITAL_BASED_OUTPATIENT_CLINIC_OR_DEPARTMENT_OTHER): Admitting: Orthopaedic Surgery

## 2024-06-20 ENCOUNTER — Other Ambulatory Visit (HOSPITAL_BASED_OUTPATIENT_CLINIC_OR_DEPARTMENT_OTHER): Payer: Self-pay | Admitting: Student

## 2024-06-20 DIAGNOSIS — M222X1 Patellofemoral disorders, right knee: Secondary | ICD-10-CM

## 2024-06-26 ENCOUNTER — Ambulatory Visit (HOSPITAL_BASED_OUTPATIENT_CLINIC_OR_DEPARTMENT_OTHER): Admitting: Orthopaedic Surgery

## 2024-06-26 ENCOUNTER — Encounter (HOSPITAL_BASED_OUTPATIENT_CLINIC_OR_DEPARTMENT_OTHER): Payer: Self-pay

## 2024-06-27 ENCOUNTER — Encounter (HOSPITAL_BASED_OUTPATIENT_CLINIC_OR_DEPARTMENT_OTHER): Payer: Self-pay

## 2024-06-27 ENCOUNTER — Ambulatory Visit (HOSPITAL_BASED_OUTPATIENT_CLINIC_OR_DEPARTMENT_OTHER): Admission: RE | Admit: 2024-06-27 | Source: Ambulatory Visit | Admitting: Radiology

## 2024-07-01 ENCOUNTER — Ambulatory Visit (HOSPITAL_BASED_OUTPATIENT_CLINIC_OR_DEPARTMENT_OTHER): Admitting: Orthopaedic Surgery

## 2024-07-03 ENCOUNTER — Other Ambulatory Visit (HOSPITAL_BASED_OUTPATIENT_CLINIC_OR_DEPARTMENT_OTHER): Payer: Self-pay | Admitting: Family Medicine

## 2024-07-03 DIAGNOSIS — Z1231 Encounter for screening mammogram for malignant neoplasm of breast: Secondary | ICD-10-CM

## 2024-07-08 ENCOUNTER — Ambulatory Visit (HOSPITAL_BASED_OUTPATIENT_CLINIC_OR_DEPARTMENT_OTHER): Admitting: Student

## 2024-07-08 ENCOUNTER — Encounter (HOSPITAL_BASED_OUTPATIENT_CLINIC_OR_DEPARTMENT_OTHER): Payer: Self-pay

## 2024-07-11 ENCOUNTER — Inpatient Hospital Stay (HOSPITAL_BASED_OUTPATIENT_CLINIC_OR_DEPARTMENT_OTHER): Admission: RE | Admit: 2024-07-11 | Source: Ambulatory Visit | Admitting: Radiology

## 2024-07-11 DIAGNOSIS — Z1231 Encounter for screening mammogram for malignant neoplasm of breast: Secondary | ICD-10-CM

## 2024-07-15 ENCOUNTER — Ambulatory Visit (HOSPITAL_BASED_OUTPATIENT_CLINIC_OR_DEPARTMENT_OTHER): Admitting: Student

## 2024-07-15 ENCOUNTER — Encounter (HOSPITAL_BASED_OUTPATIENT_CLINIC_OR_DEPARTMENT_OTHER): Payer: Self-pay

## 2024-07-22 ENCOUNTER — Ambulatory Visit (HOSPITAL_BASED_OUTPATIENT_CLINIC_OR_DEPARTMENT_OTHER): Admitting: Student

## 2024-07-22 ENCOUNTER — Ambulatory Visit (HOSPITAL_BASED_OUTPATIENT_CLINIC_OR_DEPARTMENT_OTHER)

## 2024-07-22 DIAGNOSIS — M25562 Pain in left knee: Secondary | ICD-10-CM

## 2024-07-22 DIAGNOSIS — G8929 Other chronic pain: Secondary | ICD-10-CM

## 2024-07-22 DIAGNOSIS — M25561 Pain in right knee: Secondary | ICD-10-CM | POA: Diagnosis not present

## 2024-07-22 MED ORDER — TRIAMCINOLONE ACETONIDE 40 MG/ML IJ SUSP
2.0000 mL | INTRAMUSCULAR | Status: AC | PRN
  Administered 2024-07-22: 2 mL via INTRA_ARTICULAR

## 2024-07-22 MED ORDER — LIDOCAINE HCL 1 % IJ SOLN
4.0000 mL | INTRAMUSCULAR | Status: AC | PRN
  Administered 2024-07-22: 4 mL

## 2024-07-22 NOTE — Progress Notes (Signed)
 Chief Complaint: Bilateral knee pain     History of Present Illness:   Tracy Mcpherson is a 53 y.o. female who presents today for follow-up evaluation of bilateral knee pain.  Her right knee continues to be more symptomatic than the left, although the pain in both knees is fairly limiting.  She last received cortisone injections on 12/20/2023 which gave some relief for a short period of time.  This did not eliminate her pain although did improve symptoms enough to allow her to perform ADLs.  Her right knee has been swelling.   Surgical History:   None  PMH/PSH/Family History/Social History/Meds/Allergies:    Past Medical History:  Diagnosis Date   Anemia    Anxiety    Asthma    Constipation    Epilepsy (HCC)    Fibroids    History of gestational diabetes    History of herpes simplex type 2 infection    History of pregnancy induced hypertension    Hx of leukocytosis    mild   Iron deficiency anemia    Joint pain    Lower extremity edema    Migraines    Morbid obesity (HCC)    Pain in both lower legs    Plantar fasciitis    Prediabetes    Right knee pain    Seasonal allergies    Seizures (HCC)    Sleep apnea    Vitamin B 12 deficiency    Vitamin D  deficiency    Past Surgical History:  Procedure Laterality Date   CESAREAN SECTION  11-02-2007   CRANIOTOMY FOR TEMPORAL LOBECTOMY     GYNECOLOGIC CRYOSURGERY     IUD REMOVAL  12-16-2009   REMOVAL THYROID  CYST  2000   Social History   Socioeconomic History   Marital status: Divorced    Spouse name: Not on file   Number of children: 1   Years of education: Not on file   Highest education level: Not on file  Occupational History   Occupation: Consulting civil engineer  Tobacco Use   Smoking status: Never   Smokeless tobacco: Never  Vaping Use   Vaping status: Never Used  Substance and Sexual Activity   Alcohol use: No   Drug use: No   Sexual activity: Not Currently    Birth  control/protection: None  Other Topics Concern   Not on file  Social History Narrative   Not on file   Social Drivers of Health   Financial Resource Strain: High Risk (04/11/2024)   Received from [facility]   Overall Financial Resource Strain (CARDIA)    Difficulty of Paying Living Expenses: Hard  Food Insecurity: No Food Insecurity (04/11/2024)   Received from [facility]   Hunger Vital Sign    Within the past 12 months, you worried that your food would run out before you got the money to buy more.: Never true    Within the past 12 months, the food you bought just didn't last and you didn't have money to get more.: Never true  Transportation Needs: No Transportation Needs (04/11/2024)   Received from [facility] - Transportation    In the past 12 months, has lack of transportation kept you from medical appointments or from getting medications?: No  Lack of Transportation (Non-Medical): No  Physical Activity: Not on file  Stress: Not on file  Social Connections: Unknown (03/04/2023)   Received from [facility]   Social Network    Social Network: Not on file   Family History  Problem Relation Age of Onset   Hypertension Mother    Obesity Mother    Fibroids Mother    Hypertension Sister    Cancer Maternal Uncle        lung   Cancer Maternal Grandmother        lung   Kidney disease Maternal Grandfather    Alzheimer's disease Paternal Grandmother    Allergies  Allergen Reactions   Flexeril [Cyclobenzaprine Hcl] Hives   Lamictal [Lamotrigine] Other (See Comments)    Muscles  Tightened.   Morphine And Codeine Hives   Percocet [Oxycodone-Acetaminophen ] Hives   Doxycycline Hives   Topiramate  Other (See Comments)    Affecting her thinking   Vimpat [Lacosamide]     Muscle tightness    Current Outpatient Medications  Medication Sig Dispense Refill   albuterol (VENTOLIN HFA) 108 (90 Base) MCG/ACT inhaler Inhale  into the lungs.     ASHWAGANDHA PO Take by mouth daily. (Patient not taking: Reported on 03/30/2023)     Azelastine HCl 137 MCG/SPRAY SOLN Place 1 spray into both nostrils 2 (two) times daily.     diclofenac  (VOLTAREN ) 75 MG EC tablet TAKE 1 TABLET BY MOUTH 2 TIMES DAILY AS NEEDED. 60 tablet 3   famotidine  (PEPCID ) 20 MG tablet Take 1 tablet (20 mg total) by mouth 2 (two) times daily. 60 tablet 0   fluocinonide ointment (LIDEX) 0.05 % Apply topically.     Iron-FA-B Cmp-C-Biot-Probiotic (FUSION PLUS) CAPS Take 1 capsule by mouth daily.     ketotifen (ZADITOR) 0.025 % ophthalmic solution 1 drop as needed. (Patient not taking: Reported on 03/30/2023)     Multiple Vitamin (MULTIVITAMIN WITH MINERALS) TABS tablet Take 1 tablet by mouth daily. (Patient not taking: Reported on 03/30/2023)     pantoprazole  (PROTONIX ) 40 MG tablet Take 1 tablet (40 mg total) by mouth daily. 30 tablet 0   triamcinolone  ointment (KENALOG ) 0.1 % SMARTSIG:Topical 4 Times a Week (Patient not taking: Reported on 03/30/2023)     Vitamin D , Ergocalciferol , (DRISDOL ) 1.25 MG (50000 UNIT) CAPS capsule Take 1 capsule (50,000 Units total) by mouth every 3 (three) days. (Patient taking differently: Take 50,000 Units by mouth once a week.) 10 capsule 0   No current facility-administered medications for this visit.   No results found.  Review of Systems:   A ROS was performed including pertinent positives and negatives as documented in the HPI.  Physical Exam :   Constitutional: NAD and appears stated age Neurological: Alert and oriented Psych: Appropriate affect and cooperative Last menstrual period 10/13/2019.   Comprehensive Musculoskeletal Exam:    Tenderness to palpation over bilateral medial joint lines.  Active range of motion is from 0 to 120 degrees with palpable crepitus.  Stable collaterals with varus and valgus stress.  Imaging:   Xray (4 views right knee, 4 views left knee): Slightly progressed loss of joint space  within bilateral medial compartments that appears almost bone-on-bone.   I personally reviewed and interpreted the radiographs.   Assessment:   54 y.o. female with bilateral knee osteoarthritis particular within the medial compartments.  This is nearing complete loss of joint space.  She has been managing this with cortisone injections which give her some temporary relief and she  is requesting this again today.  I think this is reasonable and injections were performed into both knees today which she tolerated well.  Patient is continuing to work towards approval for bariatric surgery which she hopes to be eligible for around the end of this year.  Can plan to have her follow-up as needed.  Plan :    -Bilateral knee cortisone injections performed     Procedure Note  Patient: Tracy Mcpherson             Date of Birth: 05/24/1971           MRN: [id number]             Visit Date: 07/22/2024  Procedures: Visit Diagnoses:  1. Chronic pain of right knee   2. Chronic pain of left knee     Large Joint Inj: bilateral knee on 07/22/2024 6:22 PM Indications: pain Details: 22 G 3.5 in needle, anterolateral approach Medications (Right): 4 mL lidocaine  1 %; 2 mL triamcinolone  acetonide 40 MG/ML Medications (Left): 4 mL lidocaine  1 %; 2 mL triamcinolone  acetonide 40 MG/ML Outcome: tolerated well, no immediate complications Procedure, treatment alternatives, risks and benefits explained, specific risks discussed. Consent was given by the patient. Immediately prior to procedure a time out was called to verify the correct patient, procedure, equipment, support staff and site/side marked as required. Patient was prepped and draped in the usual sterile fashion.       I personally saw and evaluated the patient, and participated in the management and treatment plan.   Leonce Reveal, PA-C Orthopedics   This document was dictated using Conservation officer, historic buildings. A reasonable attempt at  proof reading has been made to minimize errors.

## 2024-09-07 ENCOUNTER — Encounter: Payer: Self-pay | Admitting: Radiology

## 2025-01-07 ENCOUNTER — Ambulatory Visit (HOSPITAL_BASED_OUTPATIENT_CLINIC_OR_DEPARTMENT_OTHER): Admitting: Pulmonary Disease
